# Patient Record
Sex: Female | Born: 1935 | Race: White | Hispanic: No
Health system: Southern US, Community
[De-identification: ages and names within clinical notes are randomized; demographics above are authoritative.]

## PROBLEM LIST (undated history)

## (undated) DIAGNOSIS — M1990 Unspecified osteoarthritis, unspecified site: Secondary | ICD-10-CM

## (undated) DIAGNOSIS — K589 Irritable bowel syndrome without diarrhea: Secondary | ICD-10-CM

## (undated) DIAGNOSIS — I10 Essential (primary) hypertension: Secondary | ICD-10-CM

## (undated) DIAGNOSIS — E119 Type 2 diabetes mellitus without complications: Secondary | ICD-10-CM

## (undated) DIAGNOSIS — Z8719 Personal history of other diseases of the digestive system: Secondary | ICD-10-CM

## (undated) DIAGNOSIS — I82409 Acute embolism and thrombosis of unspecified deep veins of unspecified lower extremity: Secondary | ICD-10-CM

## (undated) DIAGNOSIS — C4490 Unspecified malignant neoplasm of skin, unspecified: Secondary | ICD-10-CM

## (undated) DIAGNOSIS — I499 Cardiac arrhythmia, unspecified: Secondary | ICD-10-CM

## (undated) HISTORY — DX: Irritable bowel syndrome without diarrhea: K58.9

## (undated) HISTORY — PX: LAPAROSCOPY: SHX197

## (undated) HISTORY — PX: REPLACEMENT TOTAL KNEE: SUR1224

## (undated) HISTORY — DX: Unspecified osteoarthritis, unspecified site: M19.90

## (undated) HISTORY — DX: Cardiac arrhythmia, unspecified: I49.9

## (undated) HISTORY — DX: Type 2 diabetes mellitus without complications: E11.9

## (undated) HISTORY — DX: Personal history of other diseases of the digestive system: Z87.19

## (undated) HISTORY — PX: CARDIAC CATHETERIZATION: SHX172

## (undated) HISTORY — PX: PARTIAL HYSTERECTOMY: SHX80

## (undated) HISTORY — PX: CATARACT EXTRACTION: SUR2

## (undated) HISTORY — DX: Unspecified malignant neoplasm of skin, unspecified: C44.90

## (undated) HISTORY — DX: Acute embolism and thrombosis of unspecified deep veins of unspecified lower extremity: I82.409

## (undated) HISTORY — PX: CHOLECYSTECTOMY: SHX55

## (undated) HISTORY — DX: Essential (primary) hypertension: I10

---

## 1998-07-11 ENCOUNTER — Emergency Department (HOSPITAL_COMMUNITY): Admission: EM | Admit: 1998-07-11 | Discharge: 1998-07-11 | Payer: Self-pay | Admitting: Emergency Medicine

## 2002-12-03 ENCOUNTER — Emergency Department (HOSPITAL_COMMUNITY): Admission: EM | Admit: 2002-12-03 | Discharge: 2002-12-03 | Payer: Self-pay | Admitting: Emergency Medicine

## 2002-12-03 ENCOUNTER — Encounter: Payer: Self-pay | Admitting: Emergency Medicine

## 2003-06-07 ENCOUNTER — Other Ambulatory Visit: Payer: Self-pay

## 2003-08-23 ENCOUNTER — Other Ambulatory Visit: Payer: Self-pay

## 2003-08-27 ENCOUNTER — Inpatient Hospital Stay (HOSPITAL_COMMUNITY): Admission: EM | Admit: 2003-08-27 | Discharge: 2003-08-31 | Payer: Self-pay

## 2005-02-22 ENCOUNTER — Other Ambulatory Visit: Payer: Self-pay

## 2005-02-22 ENCOUNTER — Emergency Department: Payer: Self-pay | Admitting: Emergency Medicine

## 2008-04-12 ENCOUNTER — Ambulatory Visit: Payer: Self-pay | Admitting: Unknown Physician Specialty

## 2008-04-24 ENCOUNTER — Ambulatory Visit: Payer: Self-pay | Admitting: Unknown Physician Specialty

## 2008-11-14 ENCOUNTER — Ambulatory Visit: Payer: Self-pay

## 2008-11-24 ENCOUNTER — Ambulatory Visit: Payer: Self-pay

## 2009-09-03 ENCOUNTER — Inpatient Hospital Stay: Payer: Self-pay | Admitting: Internal Medicine

## 2009-10-18 ENCOUNTER — Inpatient Hospital Stay: Payer: Self-pay | Admitting: Specialist

## 2010-11-27 ENCOUNTER — Ambulatory Visit: Payer: Self-pay | Admitting: Ophthalmology

## 2010-12-10 ENCOUNTER — Ambulatory Visit: Payer: Self-pay | Admitting: Ophthalmology

## 2011-01-29 ENCOUNTER — Ambulatory Visit: Payer: Self-pay | Admitting: Ophthalmology

## 2011-02-11 ENCOUNTER — Ambulatory Visit: Payer: Self-pay | Admitting: Ophthalmology

## 2011-05-30 ENCOUNTER — Ambulatory Visit: Payer: Self-pay | Admitting: Unknown Physician Specialty

## 2011-06-09 LAB — TROPONIN I: Troponin-I: <0.02 ng/mL

## 2011-06-09 LAB — BASIC METABOLIC PANEL
Anion Gap: 7 (ref 7–16)
Calcium, Total: 8.9 mg/dL (ref 8.5–10.1)
Co2: 31 mmol/L (ref 21–32)
EGFR (African American): >60
EGFR (Non-African Amer.): >60
Potassium: 4 mmol/L (ref 3.5–5.1)
Sodium: 132 mmol/L — ABNORMAL LOW (ref 136–145)

## 2011-06-09 LAB — CBC
MCH: 30.9 pg (ref 26.0–34.0)
Platelet: 181 10*3/uL (ref 150–440)
RBC: 4.1 10*6/uL (ref 3.80–5.20)
WBC: 9.3 10*3/uL (ref 3.6–11.0)

## 2011-06-10 ENCOUNTER — Observation Stay: Payer: Self-pay | Admitting: Internal Medicine

## 2011-06-10 LAB — CK TOTAL AND CKMB (NOT AT ARMC)
CK, Total: 41 U/L (ref 21–215)
CK, Total: 50 U/L (ref 21–215)
CK-MB: 0.6 ng/mL (ref 0.5–3.6)

## 2011-06-10 LAB — TROPONIN I: Troponin-I: <0.02 ng/mL

## 2011-08-08 ENCOUNTER — Emergency Department: Payer: Self-pay | Admitting: Internal Medicine

## 2011-08-08 LAB — URINALYSIS, COMPLETE
Bilirubin,UR: NEGATIVE
Blood: NEGATIVE
Glucose,UR: NEGATIVE mg/dL (ref 0–75)
Ketone: NEGATIVE
Ph: 8 (ref 4.5–8.0)
RBC,UR: 1 /HPF (ref 0–5)
WBC UR: 10 /HPF (ref 0–5)

## 2011-08-08 LAB — COMPREHENSIVE METABOLIC PANEL
Albumin: 3.4 g/dL (ref 3.4–5.0)
Alkaline Phosphatase: 91 U/L (ref 50–136)
Bilirubin,Total: 0.5 mg/dL (ref 0.2–1.0)
SGOT(AST): 25 U/L (ref 15–37)
SGPT (ALT): 15 U/L
Total Protein: 7.8 g/dL (ref 6.4–8.2)

## 2011-08-08 LAB — CBC
HCT: 37.3 % (ref 35.0–47.0)
HGB: 12.8 g/dL (ref 12.0–16.0)
Platelet: 209 10*3/uL (ref 150–440)
RDW: 12.9 % (ref 11.5–14.5)

## 2011-08-08 LAB — TROPONIN I: Troponin-I: <0.02 ng/mL

## 2011-12-12 ENCOUNTER — Ambulatory Visit: Payer: Self-pay

## 2012-01-04 ENCOUNTER — Emergency Department: Payer: Self-pay | Admitting: Emergency Medicine

## 2012-01-04 LAB — CBC WITH DIFFERENTIAL/PLATELET
Basophil #: 0 10*3/uL (ref 0.0–0.1)
HCT: 39.1 % (ref 35.0–47.0)
Lymphocyte #: 1.4 10*3/uL (ref 1.0–3.6)
MCH: 30.5 pg (ref 26.0–34.0)
MCV: 90 fL (ref 80–100)
Monocyte #: 0.4 x10 3/mm (ref 0.2–0.9)
Monocyte %: 4.9 %
Neutrophil #: 6.4 10*3/uL (ref 1.4–6.5)
RDW: 12.9 % (ref 11.5–14.5)
WBC: 8.3 10*3/uL (ref 3.6–11.0)

## 2012-01-04 LAB — COMPREHENSIVE METABOLIC PANEL
BUN: 14 mg/dL (ref 7–18)
Chloride: 95 mmol/L — ABNORMAL LOW (ref 98–107)
Co2: 31 mmol/L (ref 21–32)
Creatinine: 0.88 mg/dL (ref 0.60–1.30)
Glucose: 206 mg/dL — ABNORMAL HIGH (ref 65–99)
Potassium: 3.9 mmol/L (ref 3.5–5.1)
Sodium: 133 mmol/L — ABNORMAL LOW (ref 136–145)
Total Protein: 7.6 g/dL (ref 6.4–8.2)

## 2012-01-04 LAB — URINALYSIS, COMPLETE
Protein: NEGATIVE
Specific Gravity: 1.01 (ref 1.003–1.030)
Squamous Epithelial: 1

## 2012-01-04 LAB — LIPASE, BLOOD: Lipase: 79 U/L (ref 73–393)

## 2012-01-06 LAB — URINE CULTURE

## 2012-01-06 LAB — STOOL CULTURE

## 2012-05-16 ENCOUNTER — Inpatient Hospital Stay: Payer: Self-pay | Admitting: Internal Medicine

## 2012-05-16 LAB — COMPREHENSIVE METABOLIC PANEL
Anion Gap: 5 — ABNORMAL LOW (ref 7–16)
Calcium, Total: 8.9 mg/dL (ref 8.5–10.1)
Chloride: 98 mmol/L (ref 98–107)
Co2: 31 mmol/L (ref 21–32)
EGFR (Non-African Amer.): >60
Osmolality: 271 (ref 275–301)
Potassium: 3.4 mmol/L — ABNORMAL LOW (ref 3.5–5.1)
Sodium: 134 mmol/L — ABNORMAL LOW (ref 136–145)

## 2012-05-16 LAB — URINALYSIS, COMPLETE
Ketone: NEGATIVE
Protein: NEGATIVE
Specific Gravity: 1.006 (ref 1.003–1.030)
WBC UR: 19 /HPF (ref 0–5)

## 2012-05-16 LAB — CK TOTAL AND CKMB (NOT AT ARMC): CK-MB: 0.9 ng/mL (ref 0.5–3.6)

## 2012-05-16 LAB — CBC
HCT: 40.9 % (ref 35.0–47.0)
MCHC: 34.6 g/dL (ref 32.0–36.0)
MCV: 89 fL (ref 80–100)
Platelet: 157 10*3/uL (ref 150–440)
RDW: 12.7 % (ref 11.5–14.5)

## 2012-05-16 LAB — MAGNESIUM: Magnesium: 1.8 mg/dL

## 2012-05-16 LAB — TROPONIN I: Troponin-I: 0.07 ng/mL — ABNORMAL HIGH

## 2012-05-16 LAB — HEMOGLOBIN A1C: Hemoglobin A1C: 6.7 % — ABNORMAL HIGH (ref 4.2–6.3)

## 2012-05-17 LAB — COMPREHENSIVE METABOLIC PANEL
Alkaline Phosphatase: 84 U/L (ref 50–136)
Calcium, Total: 8.5 mg/dL (ref 8.5–10.1)
Chloride: 95 mmol/L — ABNORMAL LOW (ref 98–107)
Co2: 30 mmol/L (ref 21–32)
EGFR (Non-African Amer.): 55 — ABNORMAL LOW
Sodium: 132 mmol/L — ABNORMAL LOW (ref 136–145)

## 2012-05-17 LAB — CBC WITH DIFFERENTIAL/PLATELET
Basophil #: 0.1 10*3/uL (ref 0.0–0.1)
Basophil %: 0.7 %
Eosinophil #: 0.1 10*3/uL (ref 0.0–0.7)
HCT: 35.3 % (ref 35.0–47.0)
Lymphocyte #: 2.7 10*3/uL (ref 1.0–3.6)
Lymphocyte %: 30.5 %
MCH: 30.5 pg (ref 26.0–34.0)
MCHC: 34.3 g/dL (ref 32.0–36.0)
MCV: 89 fL (ref 80–100)
Monocyte #: 0.4 x10 3/mm (ref 0.2–0.9)
Neutrophil #: 5.5 10*3/uL (ref 1.4–6.5)
RDW: 13.2 % (ref 11.5–14.5)

## 2012-05-17 LAB — CK TOTAL AND CKMB (NOT AT ARMC)
CK, Total: 38 U/L (ref 21–215)
CK-MB: <0.5 ng/mL — ABNORMAL LOW (ref 0.5–3.6)

## 2012-05-18 LAB — URINE CULTURE

## 2012-11-16 ENCOUNTER — Emergency Department: Payer: Self-pay | Admitting: Emergency Medicine

## 2012-11-16 LAB — URINALYSIS, COMPLETE
Blood: NEGATIVE
Glucose,UR: NEGATIVE mg/dL (ref 0–75)
Ketone: NEGATIVE
Nitrite: NEGATIVE
Ph: 7 (ref 4.5–8.0)
Protein: NEGATIVE
Specific Gravity: 1.009 (ref 1.003–1.030)
WBC UR: 3 /HPF (ref 0–5)

## 2012-11-16 LAB — COMPREHENSIVE METABOLIC PANEL
Albumin: 3.5 g/dL (ref 3.4–5.0)
Anion Gap: 6 — ABNORMAL LOW (ref 7–16)
BUN: 12 mg/dL (ref 7–18)
Bilirubin,Total: 1 mg/dL (ref 0.2–1.0)
Calcium, Total: 8.9 mg/dL (ref 8.5–10.1)
Chloride: 96 mmol/L — ABNORMAL LOW (ref 98–107)
Co2: 30 mmol/L (ref 21–32)
Creatinine: 0.92 mg/dL (ref 0.60–1.30)
Osmolality: 270 (ref 275–301)
Potassium: 3.5 mmol/L (ref 3.5–5.1)
SGOT(AST): 22 U/L (ref 15–37)
Total Protein: 7.5 g/dL (ref 6.4–8.2)

## 2012-11-16 LAB — CBC
HGB: 12.4 g/dL (ref 12.0–16.0)
MCV: 89 fL (ref 80–100)
RDW: 13.1 % (ref 11.5–14.5)

## 2012-11-16 LAB — TROPONIN I: Troponin-I: <0.02 ng/mL

## 2012-11-16 LAB — CK TOTAL AND CKMB (NOT AT ARMC): CK-MB: 0.8 ng/mL (ref 0.5–3.6)

## 2013-09-15 ENCOUNTER — Ambulatory Visit: Payer: Self-pay | Admitting: Family Medicine

## 2013-12-22 ENCOUNTER — Ambulatory Visit: Payer: Self-pay | Admitting: Family Medicine

## 2013-12-25 LAB — BASIC METABOLIC PANEL
Anion Gap: 7 (ref 7–16)
BUN: 11 mg/dL (ref 7–18)
Calcium, Total: 8.8 mg/dL (ref 8.5–10.1)
Chloride: 97 mmol/L — ABNORMAL LOW (ref 98–107)
Co2: 29 mmol/L (ref 21–32)
Creatinine: 0.84 mg/dL (ref 0.60–1.30)
EGFR (African American): >60
EGFR (Non-African Amer.): >60
GLUCOSE: 195 mg/dL — AB (ref 65–99)
Osmolality: 271 (ref 275–301)
Potassium: 3.9 mmol/L (ref 3.5–5.1)
Sodium: 133 mmol/L — ABNORMAL LOW (ref 136–145)

## 2013-12-25 LAB — CBC
HCT: 39 % (ref 35.0–47.0)
HGB: 13.1 g/dL (ref 12.0–16.0)
MCH: 31 pg (ref 26.0–34.0)
MCHC: 33.6 g/dL (ref 32.0–36.0)
MCV: 92 fL (ref 80–100)
Platelet: 160 10*3/uL (ref 150–440)
RBC: 4.23 10*6/uL (ref 3.80–5.20)
RDW: 13.6 % (ref 11.5–14.5)
WBC: 7.1 10*3/uL (ref 3.6–11.0)

## 2013-12-25 LAB — PRO B NATRIURETIC PEPTIDE: B-Type Natriuretic Peptide: 696 pg/mL — ABNORMAL HIGH (ref 0–450)

## 2013-12-25 LAB — TROPONIN I: Troponin-I: <0.02 ng/mL

## 2013-12-26 ENCOUNTER — Observation Stay: Payer: Self-pay | Admitting: Internal Medicine

## 2013-12-26 LAB — CBC WITH DIFFERENTIAL/PLATELET
BASOS ABS: 0.1 10*3/uL (ref 0.0–0.1)
Basophil %: 1.1 %
Eosinophil #: 0.1 10*3/uL (ref 0.0–0.7)
Eosinophil %: 1.5 %
HCT: 38.1 % (ref 35.0–47.0)
HGB: 12.9 g/dL (ref 12.0–16.0)
Lymphocyte #: 2.7 10*3/uL (ref 1.0–3.6)
Lymphocyte %: 27.9 %
MCH: 30.7 pg (ref 26.0–34.0)
MCHC: 33.9 g/dL (ref 32.0–36.0)
MCV: 91 fL (ref 80–100)
MONO ABS: 0.5 x10 3/mm (ref 0.2–0.9)
Monocyte %: 5.4 %
NEUTROS PCT: 64.1 %
Neutrophil #: 6.2 10*3/uL (ref 1.4–6.5)
Platelet: 168 10*3/uL (ref 150–440)
RBC: 4.21 10*6/uL (ref 3.80–5.20)
RDW: 13.5 % (ref 11.5–14.5)
WBC: 9.7 10*3/uL (ref 3.6–11.0)

## 2013-12-26 LAB — HEMOGLOBIN A1C: Hemoglobin A1C: 6.3 % (ref 4.2–6.3)

## 2013-12-26 LAB — CK TOTAL AND CKMB (NOT AT ARMC)
CK, TOTAL: 43 U/L
CK, TOTAL: 54 U/L
CK, Total: 43 U/L
CK-MB: 0.9 ng/mL (ref 0.5–3.6)
CK-MB: 1 ng/mL (ref 0.5–3.6)
CK-MB: 1 ng/mL (ref 0.5–3.6)

## 2013-12-26 LAB — TROPONIN I: Troponin-I: <0.02 ng/mL

## 2013-12-26 LAB — BASIC METABOLIC PANEL
Anion Gap: 8 (ref 7–16)
BUN: 10 mg/dL (ref 7–18)
Calcium, Total: 8.8 mg/dL (ref 8.5–10.1)
Chloride: 99 mmol/L (ref 98–107)
Co2: 27 mmol/L (ref 21–32)
Creatinine: 0.75 mg/dL (ref 0.60–1.30)
EGFR (Non-African Amer.): >60
Glucose: 108 mg/dL — ABNORMAL HIGH (ref 65–99)
Osmolality: 268 (ref 275–301)
Potassium: 3.5 mmol/L (ref 3.5–5.1)
Sodium: 134 mmol/L — ABNORMAL LOW (ref 136–145)

## 2013-12-26 LAB — MAGNESIUM: Magnesium: 1.8 mg/dL

## 2013-12-30 LAB — CULTURE, BLOOD (SINGLE)

## 2014-01-03 ENCOUNTER — Ambulatory Visit: Payer: Self-pay | Admitting: Family

## 2014-01-18 LAB — URINALYSIS, COMPLETE
BILIRUBIN, UR: NEGATIVE
BLOOD: NEGATIVE
Glucose,UR: NEGATIVE mg/dL (ref 0–75)
KETONE: NEGATIVE
NITRITE: NEGATIVE
Ph: 5 (ref 4.5–8.0)
Protein: 30
RBC,UR: 3 /HPF (ref 0–5)
SPECIFIC GRAVITY: 1.02 (ref 1.003–1.030)
WBC UR: 242 /HPF (ref 0–5)

## 2014-01-18 LAB — CBC
HCT: 35.1 % (ref 35.0–47.0)
HGB: 11.6 g/dL — ABNORMAL LOW (ref 12.0–16.0)
MCH: 30.4 pg (ref 26.0–34.0)
MCHC: 33.1 g/dL (ref 32.0–36.0)
MCV: 92 fL (ref 80–100)
PLATELETS: 177 10*3/uL (ref 150–440)
RBC: 3.82 10*6/uL (ref 3.80–5.20)
RDW: 13.5 % (ref 11.5–14.5)
WBC: 6.8 10*3/uL (ref 3.6–11.0)

## 2014-01-18 LAB — COMPREHENSIVE METABOLIC PANEL
ALBUMIN: 3.2 g/dL — AB (ref 3.4–5.0)
Alkaline Phosphatase: 77 U/L
Anion Gap: 8 (ref 7–16)
BILIRUBIN TOTAL: 0.6 mg/dL (ref 0.2–1.0)
BUN: 17 mg/dL (ref 7–18)
CHLORIDE: 100 mmol/L (ref 98–107)
CREATININE: 1.05 mg/dL (ref 0.60–1.30)
Calcium, Total: 8.7 mg/dL (ref 8.5–10.1)
Co2: 28 mmol/L (ref 21–32)
EGFR (African American): 55 — ABNORMAL LOW
EGFR (Non-African Amer.): 47 — ABNORMAL LOW
GLUCOSE: 171 mg/dL — AB (ref 65–99)
Osmolality: 278 (ref 275–301)
Potassium: 4.1 mmol/L (ref 3.5–5.1)
SGOT(AST): 25 U/L (ref 15–37)
SGPT (ALT): 11 U/L — ABNORMAL LOW
Sodium: 136 mmol/L (ref 136–145)
TOTAL PROTEIN: 7.3 g/dL (ref 6.4–8.2)

## 2014-01-18 LAB — TROPONIN I
Troponin-I: <0.02 ng/mL
Troponin-I: <0.02 ng/mL
Troponin-I: <0.02 ng/mL

## 2014-01-18 LAB — APTT: ACTIVATED PTT: 26.4 s (ref 23.6–35.9)

## 2014-01-18 LAB — PROTIME-INR
INR: 1.1
PROTHROMBIN TIME: 14.1 s (ref 11.5–14.7)

## 2014-01-19 ENCOUNTER — Inpatient Hospital Stay: Payer: Self-pay | Admitting: Internal Medicine

## 2014-01-19 ENCOUNTER — Ambulatory Visit: Payer: Self-pay | Admitting: Neurology

## 2014-01-19 DIAGNOSIS — R55 Syncope and collapse: Secondary | ICD-10-CM

## 2014-01-19 DIAGNOSIS — I10 Essential (primary) hypertension: Secondary | ICD-10-CM

## 2014-01-19 LAB — CBC WITH DIFFERENTIAL/PLATELET
BASOS ABS: 0.1 10*3/uL (ref 0.0–0.1)
BASOS PCT: 1.2 %
EOS ABS: 0.3 10*3/uL (ref 0.0–0.7)
Eosinophil %: 3.6 %
HCT: 36.3 % (ref 35.0–47.0)
HGB: 12.1 g/dL (ref 12.0–16.0)
Lymphocyte #: 2.2 10*3/uL (ref 1.0–3.6)
Lymphocyte %: 29.2 %
MCH: 30.4 pg (ref 26.0–34.0)
MCHC: 33.3 g/dL (ref 32.0–36.0)
MCV: 91 fL (ref 80–100)
Monocyte #: 0.4 x10 3/mm (ref 0.2–0.9)
Monocyte %: 5.1 %
NEUTROS ABS: 4.5 10*3/uL (ref 1.4–6.5)
Neutrophil %: 60.9 %
Platelet: 153 10*3/uL (ref 150–440)
RBC: 3.97 10*6/uL (ref 3.80–5.20)
RDW: 13.4 % (ref 11.5–14.5)
WBC: 7.4 10*3/uL (ref 3.6–11.0)

## 2014-01-19 LAB — BASIC METABOLIC PANEL
Anion Gap: 8 (ref 7–16)
BUN: 15 mg/dL (ref 7–18)
CALCIUM: 8.6 mg/dL (ref 8.5–10.1)
CREATININE: 0.83 mg/dL (ref 0.60–1.30)
Chloride: 103 mmol/L (ref 98–107)
Co2: 27 mmol/L (ref 21–32)
EGFR (African American): >60
EGFR (Non-African Amer.): >60
GLUCOSE: 129 mg/dL — AB (ref 65–99)
Osmolality: 278 (ref 275–301)
POTASSIUM: 3.8 mmol/L (ref 3.5–5.1)
Sodium: 138 mmol/L (ref 136–145)

## 2014-01-21 ENCOUNTER — Emergency Department: Payer: Self-pay | Admitting: Emergency Medicine

## 2014-01-21 LAB — COMPREHENSIVE METABOLIC PANEL
ALBUMIN: 2.9 g/dL — AB (ref 3.4–5.0)
ALT: 28 U/L
Alkaline Phosphatase: 79 U/L
Anion Gap: 7 (ref 7–16)
BUN: 14 mg/dL (ref 7–18)
Bilirubin,Total: 0.8 mg/dL (ref 0.2–1.0)
CREATININE: 0.8 mg/dL (ref 0.60–1.30)
Calcium, Total: 8.6 mg/dL (ref 8.5–10.1)
Chloride: 103 mmol/L (ref 98–107)
Co2: 26 mmol/L (ref 21–32)
EGFR (African American): >60
Glucose: 184 mg/dL — ABNORMAL HIGH (ref 65–99)
OSMOLALITY: 277 (ref 275–301)
Potassium: 4 mmol/L (ref 3.5–5.1)
SGOT(AST): 30 U/L (ref 15–37)
Sodium: 136 mmol/L (ref 136–145)
Total Protein: 6.8 g/dL (ref 6.4–8.2)

## 2014-01-21 LAB — URINALYSIS, COMPLETE
BILIRUBIN, UR: NEGATIVE
Blood: NEGATIVE
Glucose,UR: NEGATIVE mg/dL (ref 0–75)
Hyaline Cast: 4
KETONE: NEGATIVE
LEUKOCYTE ESTERASE: NEGATIVE
Nitrite: NEGATIVE
Ph: 5 (ref 4.5–8.0)
Protein: 30
RBC,UR: 2 /HPF (ref 0–5)
Specific Gravity: 1.02 (ref 1.003–1.030)
Squamous Epithelial: 2
WBC UR: 16 /HPF (ref 0–5)

## 2014-01-21 LAB — CBC
HCT: 33.8 % — ABNORMAL LOW (ref 35.0–47.0)
HGB: 11.2 g/dL — ABNORMAL LOW (ref 12.0–16.0)
MCH: 30.6 pg (ref 26.0–34.0)
MCHC: 33.3 g/dL (ref 32.0–36.0)
MCV: 92 fL (ref 80–100)
Platelet: 141 10*3/uL — ABNORMAL LOW (ref 150–440)
RBC: 3.67 10*6/uL — AB (ref 3.80–5.20)
RDW: 13.3 % (ref 11.5–14.5)
WBC: 8.3 10*3/uL (ref 3.6–11.0)

## 2014-01-21 LAB — PRO B NATRIURETIC PEPTIDE: B-Type Natriuretic Peptide: 654 pg/mL — ABNORMAL HIGH (ref 0–450)

## 2014-01-21 LAB — TROPONIN I

## 2014-01-21 LAB — TSH: THYROID STIMULATING HORM: 4.58 u[IU]/mL — AB

## 2014-01-22 LAB — URINE CULTURE

## 2014-09-19 NOTE — Discharge Summary (Signed)
PATIENT NAME:  Erika Huff, Erika Huff MR#:  [id number] DATE OF BIRTH:  23-Dec-1923  DATE OF ADMISSION:  05/16/2012 DATE OF DISCHARGE:  05/19/2012  DISCHARGE DIAGNOSES: 1. Strokelike symptoms secondary to malignant hypertension, resolved. 2. Anxiety and depression.  3. Overactive bladder.  4. History of hypertension. 5. Vertigo.  DIET: Low sodium diet.  DISPOSITION: Discharge home with home physical therapy. Advised to follow up with Dr. Jeananne Rama. She has an appointment with Dr. Jeananne Rama on 05/25/2011, Monday.   CONSULTANTS: Lanae Boast, MD  ALLERGIES: She has multiple allergies to multiple antibiotics and medications.   HOSPITAL COURSE:  1. This is an 81 year old female patient with history of proximal atrial fibrillation, diastole heart failure, hypertension, diet-controlled diabetes deep, DVT and history of chronic low back pain who came in because of not feeling well. The patient felt weak and had numbness of the body, on the left side. The patient's systolic blood pressure was more than 200. The patient's blood pressure was 221/190 on admission. The patient has been admitted to hospitalist service for management of hypertension with questionable stroke because of left-sided weakness. The patient was started her home blood pressure medications and added nitro   topical paste along with clonidine. The patient was monitored on telemetry. CT of head on admission did not show any acute changes, except atrophy. The patient got a MRI of the brain and carotid ultrasound. The patient was taking aspirin before she came, but she was started on Plavix because of her history and questionable stroke. The patient's blood pressure improved nicely with heart rate was in the 50s. She was getting Coreg 25 mg twice a day, but we had to decrease it because of bradycardia. She was continued on her HCTZ 25 mg daily. The patient's troponins are negative. EKG did not show any acute changes. Her MRI of the brain also did  not show any stroke. The patient's has chronic vessel ischemic changes and Echo showed normal EF with binocular pattern. The patient was thought to have stroke symptoms secondary to her uncontrolled hypertension. As I mentioned, she was started on Imdur 30 mg in the morning to help her with blood pressure and she can continue her HCTZ that she is taking. We will continue that and decrease the Coreg because of bradycardia. The patient's blood pressure at the time of discharge is stable.  2. Klebsiella UTI by urine culture, but UA did not show any leukocyte esterase. no nitrites. The patient's WBC is stable, did not have any fever, and WBC 8.9. The patient's BUN is 12 and creatinine 0.67. Urinalysis showed leukocyte esterase 1+ and WBC only 19. So because of her multiple allergies to antibiotics, I asked Dr. Clayborn Bigness to see to decide on which antibiotic she can take. She was started on Macrodantin in the hospital, but because Klebsiella is resistant to Macrodantin, she did not have fever, WBC was normal, and the patient's is UA is not significant for any infection, he suggested that because of her history ofhysterectomy, urinary retention, she has asymptomatic bacteriuria which does not require  treatment, we did not give her any antibiotics and felt that antibiotic is not needed and she has multiple allergies, and in case she really needs this she will not have any choice. This was conveyed to the patient's daughter in detail.  3. The patient has depression and anxiety and she was  started on Zoloft here to help her with symptoms. Her daughter was concerned about her being emotionally unstable sometimes, giving her a  lot of stress, and asked for some help to see if she can be started on any SSRI or antidepressant so we started her on Zoloft 25 mg p.o. at bedtime. The patient was seen by the physical therapist who recommended physical therapy and home with home health. A prescription was given  for RW  4. Mild  hyponatremia on admission, improved with the fluids. The patient had some urinary retention in the Emergency Room, but she was started on oxybutynin for overactive bladder here. The patient initially got a Foley in the ER, but that was discontinued and we checked post residual. It was around 150. The patient did not have any problems urinating. So, the patient is discharged home in stable condition.   TIME SPENT ON DISCHARGE PREPARATION:  More than 30 minutes.  ____________________________ Epifanio Lesches, MD sk:sb D: 05/20/2012 23:22:45 ET T: 05/21/2012 11:09:13 ET JOB#: [id number]  cc: Epifanio Lesches, MD, <Dictator> Epifanio Lesches MD ELECTRONICALLY SIGNED 05/23/2012 13:26
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
## 2014-09-19 NOTE — Consult Note (Signed)
Impression: 81yo female w/ h/o urinary overflow incontinence and HTN admitted with hypertensive urgency and bacturia.  Her stroke-like symptoms appear to be related to her elevated BP on admission.  They have resolved with control of her BP.  Her MRI showed no evidence of CVA. She denies symptoms specific for UTI.  She has some lower abd cramping, but no dysuria or frequency.  She has no nitrites or  and minimal LE on her urinalysis.  I suspect that given her history of hysterectomy and urinary incontinence that she has structural anatomical changes which cause her to retain urine.  She then has urinary overflow incontinence.  This allow for bacterial colonization of the bladder.  There is no need to treat asymptomatic bacturia. She does have multiple allergies which may make treatment of an infection difficult in the future.  She is a poor historian, but does relate swelling and breathing problems with many of the antibiotics listed on her allergy list.    Electronic Signatures: Marciana Uplinger, Heinz Knuckles (MD) (Signed on 17-Dec-13 15:55)  Authored   Last Updated: 17-Dec-13 16:05 by Adine Heimann, Heinz Knuckles (MD)

## 2014-09-22 NOTE — H&P (Signed)
PATIENT NAME:  Erika Huff, Erika Huff MR#:  [id number] DATE OF BIRTH:  18-Sep-1923  DATE OF ADMISSION:  05/16/2012  PRIMARY CARE PHYSICIAN: Dr. Jeananne Rama.   HISTORY OF PRESENT ILLNESS: The patient is an 81 year old Caucasian female with past medical history significant for history of paroxysmal atrial fibrillation, history of congestive heart failure diastolic, hypertension, diabetes, deep venous thrombosis, chronic lower back pain and vertigo who presented to the hospital with complaints of not feeling well. According to the patient, she was watching TV earlier today when she tried to get up to go to turn off her heater and hence she started falling. She was able to reach out and grab the heater itself. She was very unsteady on her feet. Then she finally came back to her bed and lay down on the bed. She stated that she probably almost passed out on the bed and she felt numbness and weakness coming over her, mostly on the left side. She felt weakness as well as numbness  feeling on the left side of her body, mostly on the face, then it went to the arm and then to her leg. Again, she tells me that she felt cold in both legs and felt very weak. Because of that weakness, she decided to come to the Emergency Room for further evaluation. In the Emergency Room, she was noted to be severely hypertensive with systolic blood pressure above 200 and was found to have urinary tract infection and hospitalist services were contacted for admission.   PAST MEDICAL HISTORY: See history of admission for chest pain. Underwent Myoview stress test in January 2013, which was negative. History of paroxysmal atrial fibrillation, it resolved in normal sinus rhythm, congestive heart failure, chronic diastolic hypertension, diabetes mellitus, DVT, osteoarthritis, chronic lower back pain, symmetrical, history of irritable bowel syndrome, diverticulosis with GI bleed in the past, history of radicular leg pain, osteoporosis, nephrolithiasis, history  of atrial fibrillation, previously on Coumadin therapy and now stopped since GI bleed in the past and history of left lower extremity clot.   PAST SURGICAL HISTORY: Exploratory laparotomy to dissect  varicose veins around ovaries, cholecystectomy, carpal tunnel surgery, multiple surgeries for skin cancer, right elbow surgery, bilateral knee surgeries, partial hysterectomy, cardiac catheterization in [place] in 2011 and bilateral cataract surgery.  ALLERGIES: MULTIPLE INCLUDING ALTACE. AMOXICILLIN, AVELOX, BENADRYL, CELEBREX, CIPRO, CODEINE, COZAAR, DIOVAN, ENTEX, EVISTA, GLUCOPHAGE, KEFLEX, NORVASC, PAXIL, PENICILLIN, SULFA, TESSALON PEARLS, VASOTEC AS WELL AS ZITHROMAX.   MEDICATIONS: According to medical records, the patient is on aspirin 81 mg p.o. daily, carvedilol 25 mg daily, cetirizine 10 mg p.o. daily, hydrochlorothiazide 25 mg p.o. daily, meclizine 25 mg 3 times daily as needed. She is also on neo/poly/dex ophthalmic suspension one drop to affected eye twice a day due to recent diagnosis of right eye infection.   SOCIAL HISTORY: The patient lives in [place] with her daughter. No tobacco, alcohol or drug abuse.   FAMILY HISTORY: Diabetes. The patient's son had prostate cancer.   REVIEW OF SYSTEMS:  CONSTITUTIONAL: Multiple problems including feeling feverish and chilly. She has significant fatigue and weakness. She has pains in the lower part of her abdomen, suprapubic area since yesterday. She has been having problems with weight loss from 200 down to 160 pounds in approximately 1 year. Bilateral cataract removal, now she uses reading glasses. She has right infected eye according to her ophthalmologist. She has been having problems with dry nose. Complaints of greenish phlegm and some intermittent bleeding per rectum. Cough with phlegm production as well  as hemoptysis, also intermittent shortness of breath, intermittent chest pains on and off over the past 1 year. Arrhythmias and possibly  syncopal earlier today and palpitations. Also intermittent diarrhea and urinary incontinence. The patient tells me that she has a large amount of  watery stool early in the mornings, each morning. She has been using pads for her urinary incontinence, which is urge as well as stress. She has been having problems with dysuria, burning sensation as well as pain on urination. Denies hematuria. Denies any fevers, weight gain.  EYES: Denies any blurry vision, double vision or glaucoma.  ENT: Denies any tinnitus, allergies, epistaxis, sinus pain, dentures, or difficulty swallowing.  RESPIRATORY: Denies any wheezes, asthma or chronic obstructive pulmonary disease.  CARDIOVASCULAR: Denies any orthopnea or edema.  GASTROINTESTINAL: Denies nausea, vomiting, hematemesis, rectal bleeding or change in bowel habits. Admits to intermittent rectal bleeding. GENITOURINARY: Denies hematuria, increased frequency of urination. incontinence.  SKIN: Denies any acne, rashes, lesions, or change in moles.  MUSCULOSKELETAL: Denies arthritis, cramps, swelling. Admits to having left-sided numbness and weakness, especially lower extremity weakness. Denies any dysarthria, but admits of having some problems with slurring of speech earlier today.  PSYCHIATRIC: Denies anxiety, insomnia or depression.   PHYSICAL EXAMINATION:  VITAL SIGNS: On arrival to the hospital, temperature 97.4, pulse 100, respirations 18, blood pressure 221/119, and oxygen saturation was 97% on room air.  GENERAL: This is a well-developed, well-nourished, obese, Caucasian female, somewhat anxious and uncomfortable grabbing intermittently her lower part of the abdomen, sitting on the stretcher.  HEENT: Her pupils are equal and reactive to light. No icterus or conjunctivitis. Has normal hearing. No pharyngeal erythema. Mucosa is moist.  NECK: No masses, supple, nontender. Thyroid is not enlarged. No adenopathy. No JVD or carotid bruits bilaterally. Full range of  motion.  LUNGS: Clear to auscultation anteriorly, however, some rales as well as crackles are heard posteriorly in all lung fields, mostly on the left side. No significant diminished breath sounds or wheezing. No labored respirations, increased effort, dullness to percussion. Not in respiratory distress.  CARDIOVASCULAR: S1, S2 appreciated, rythm is regular, no gallops.   EXTREMITIES: peripheral pulses. No lower extremity edema, calf tenderness, or cyanosis was noted.  ABDOMEN: Soft, nontender. Bowel sounds present. No hepatosplenomegaly or masses were noted. The patient is uncomfortable whenever she is palpated in suprapubic area, but no rebound or guarding was noted.  RECTAL: Deferred.  MUSCULOSKELETAL: Able to move all extremities. The patient does have weakness 4 out of 5 in the left lower extremity as well as some mild weakness 5- our of  5 in the left upper extremity. No cyanosis. No degenerative joint disease or kyphosis was noted. Gait was not tested.  SKIN: Did not reveal any rashes, lesions, erythema, nodularity or induration. It was warm and dry to palpation.  LYMPH: No adenopathy in the cervical region.  NEUROLOGIC: Cranial nerves grossly intact. Sensory is grossly intact. No Babinski bilaterally. No dysarthria or aphasia. The patient is alert, to time, person, and place, cooperative. Memory is good. No significant confusion, agitation; however, the patient is very anxious and has multiple complaints about multiple areas.   The patient's EKG showed possible ectopic atrial rhythm at a rate of 98 beats per minute, normal axis, nonspecific T wave abnormality. Lab data showed a sodium level of 134, potassium 3.4, otherwise BMP was unremarkable. Glucose level of 156, otherwise unremarkable BMP. The patient's liver enzymes showed total protein elevated to 8.5, otherwise unremarkable study. Cardiac enzymes, first set negative.  CBC: White blood cell count 8.0, hemoglobin 14.1, platelet count 157.  Urinalysis straw clear urine, 50 mg/dL glucose, negative for bilirubin, or ketones, specific gravity 1.006, pH 5.0, negative for blood, protein, nitrites, 1+ leukocyte esterase, 2 red blood cells, 19 white blood cells, 1+ bacteria, less than 1 epithelial cell.   RADIOLOGIC STUDIES: CT scan of head without contrast on 13th December, 2013 revealed l atrophy and chronic microvascular ischemic disease changes. No acute intracranial abnormality or significant change was noted.   ASSESSMENT AND PLAN:  1.  Malignant hypertension: Admit the patient to the medical floor. Start her on blood pressure medications. Resume her outpatient medications. Add nitroglycerin topically as well as clonidine p.o. In view of her allergies, will not be able to initiate her on Norvasc or ACE inhibitor.  2.  Questionable stroke with left lower extremity weakness: Get MRI of her brain as well as carotid ultrasound and echocardiogram. We will discontinue aspirin and we will start the patient on Plavix.  3.  Dizziness: Possibly related to elevated blood pressure. Continue the patient on meclizine and treat blood pressure readings.  4.  Hyponatremia: Questionable mild dehydration. The patient was already given intravenous fluids in the Emergency Room, We will follow the patient's sodium level in the morning. We will also get a chest x-ray to rule out some element of congestive heart failure.  5.  Hypokalemia: Supplement p.o. Get magnesium level and supplement if needed.  6.  History of diabetes: Continue outpatient medications. Sliding-scale insulin.  7.  Congestive heart failure: Left heart, diastolic, chronic. We will get chest x-ray repeated again as above as the patient's clinical presentation could be consistent with congestive heart failure exacerbation.  8.  Abnormal EKG: we'll cycle cardiac enzymes x3. We will continue the patient on Coreg and will  supplement potassium as well as magnesium.  9.  Cough with questionable  bronchitis: We will initiate the patient on Levaquin. 10 Urinary tract infection: We will get urine cultures and continue the patient on Levaquin.   TIME SPENT: 50 minutes on the patient.  ____________________________ Theodoro Grist, MD rv:aw D: 05/16/2012 13:03:31 ET T: 05/17/2012 06:48:07 ET  JOB#: [id number]  cc: Guadalupe Maple, MD Esteen Delpriore MD ELECTRONICALLY SIGNED 06/23/2012 13:55

## 2014-09-23 NOTE — H&P (Signed)
PATIENT NAME:  Erika Huff, Erika Huff MR#:  [id number] DATE OF BIRTH:  1924/01/01  DATE OF ADMISSION:  12/25/2013  REFERRING PHYSICIAN: Dr. Hinda Kehr.   PRIMARY CARE PHYSICIAN: Dr. Golden Pop.   PRIMARY CARDIOLOGIST: Dr. Ubaldo Glassing.   CHIEF COMPLAINT: This is an 81 year old female with known history of diastolic CHF, paroxysmal Afib (currently in normal sinus rhythm), hypertension, diabetes, history of DVT in the past, presents with complaints of shortness of breath, cough with blood-tinged sputum and chest tightness. The patient reports she has been feeling weak over the last week. She saw Dr. Jeananne Rama on a regular scheduled appointment where he checked x-ray, where he told her she has a pneumonia and started her on doxycycline for pneumonia. The patient reports she still has shortness of breath which prompted her to come to the ED. The patient was slightly tachycardic, saturating 92% on room air. Her chest x-ray did not show any infiltrate or pneumonia, but it did show evidence of mild vascular congestion. The patient denies any orthopnea, any worsening lower extremity edema. Did not have any JVD. The patient reports she has been having chest pain left mid sternal, pressure quality over the last week. Worsened by taking a deep breath, with no relieving factor. The patient's troponin was negative. Her EKG did not have any significant ST or T wave abnormalities. Hospitalist service was requested to admit the patient for further evaluation of her shortness of breath.   PAST MEDICAL HISTORY:  1. History of chest pain in the past. Underwent Myoview stress test in January 2013 which was negative.  2. Atrial fibrillation, resolved. Now, the patient is in normal sinus rhythm.   3. Congestive heart failure, chronic diastolic.  4. Hypertension.  5. Diabetes.  6. History of DVT.   7. Osteoarthritis.  8. Chronic lower back pain.  9. Irritable bowel syndrome.  10. Diverticulitis.  11. History of GI bleed in the  past.   PAST SURGICAL HISTORY:  1. Exploratory laparotomy.  2. Bilateral knee surgery.  3. Partial hysterectomy.  4. Cardiac cath  in 2011.   5. Bilateral cataract surgery.   ALLERGIES: THE PATIENT HAS MULTIPLE ALLERGIES, INCLUDING ALTACE, AMOXICILLIN, AVELOX, BENADRYL, CELEBREX, CIPRO, CODEINE, COZAAR, DIOVAN, ENTEX, EVISTA, GLUCOPHAGE, KEFLEX, NORVASC, PAXIL, PENICILLIN, SULFA, TESSALON PERLES, VASOTEC, AS WELL AS ZITHROMAX.   HOME MEDICATIONS:  1. Lorazepam 0.5 mg oral daily as needed.  2. Coreg 25 mg oral 2 times a day.  3. Omeprazole 40 mg oral daily.  4. Isosorbide mononitrate extended release 30 mg oral daily.  5. Vitamin D3 one tablet daily.  6. Vitamin B12 one tablet daily.  7. Zyrtec 10 mg oral daily.  8. Aspirin 81 mg daily.  9. Recently started on doxycycline 100 mg 1 tablet 2 times a day. She has been taking it a total of 3 days.   FAMILY HISTORY: Significant for diabetes and son had history of prostate cancer.   SOCIAL HISTORY: The patient lives at home with her daughter. No tobacco. No alcohol. No drug use.   REVIEW OF SYSTEMS:  CONSTITUTIONAL: The patient reports fatigue, weakness. Denies fever, chills.  EYES: Denies blurry vision, double vision or inflammation.  EARS, NOSE, THROAT: Denies tinnitus, ear pain, hearing loss, epistaxis.  RESPIRATORY: Reports cough with blood-tinged sputum. Reports shortness of breath. Denies any history of COPD or wheezing.   CARDIOVASCULAR: Reports chest tightness. Denies edema, palpitations, syncope.  GASTROINTESTINAL: Denies nausea, vomiting, diarrhea, abdominal pain, hematemesis.  GENITOURINARY: Denies dysuria, hematuria or renal colic.  ENDOCRINE:  Denies polyuria, polydipsia, heat or cold intolerance.  HEMATOLOGY: Denies anemia, easy bruising, bleeding diathesis .  INTEGUMENTARY: Denies acne, rash or skin lesion.  MUSCULOSKELETAL: Denies any swelling, gout, cramps.  NEUROLOGIC: Denies CVA, TIA, dysarthria, epilepsy.   PSYCHIATRIC: Denies anxiety, insomnia or depression.   PHYSICAL EXAMINATION:  VITAL SIGNS: Temperature 97.5, pulse 82, respiratory rate 20,  saturating 98% on 2 liters nasal cannula.  GENERAL: Elderly female, looks comfortable in bed, in no apparent distress.  HEENT: Head is atraumatic, normocephalic. Pupils equal, reactive to light. Pink conjunctivae. Anicteric sclerae. Moist oral mucosa.  NECK: Supple. No thyromegaly. No JVD.  CHEST: Good air entry bilaterally. No wheezing, rales, rhonchi.  CARDIOVASCULAR: S1, S2 heard. No rubs, murmurs or gallops.  ABDOMEN: Soft, nontender, nondistended. Bowel sounds present.  EXTREMITIES: No edema. No clubbing. No cyanosis. Pedal pulses +2 bilaterally.  PSYCHIATRIC: Appropriate affect. Awake, alert x 3. Intact judgment and insight.  NEUROLOGIC: Cranial nerves grossly intact. Motor 5 out of 5. No focal deficits.  MUSCULOSKELETAL: No joint effusion or erythema.  SKIN: Normal skin turgor. Warm and dry.   PERTINENT LABORATORIES: Glucose 195. BNP 696. BUN 11, creatinine 0.84, sodium 133, potassium 3.9, chloride 97. Troponin less than 0.02. White blood cells 7.1, hemoglobin 13.1, hematocrit 39, platelets 160.   IMAGING: Chest x-ray: Cardiac enlargement with pulmonary vascular congestion and developing edema since previous study. Small bilateral pleural effusion. Chest x-ray done on July 23rd showing small left pleural effusion with left  infiltrate. No pulmonary edema.   ASSESSMENT AND PLAN:  1. Shortness of breath: Etiology is unclear. Currently, x-ray does not show any evidence of pneumonia even though the previous one shows possible pneumonia. Etiology might be related to pneumonia versus acute bronchitis versus congestive heart failure as she has evidence of vascular congestion on her x-ray. The patient has multiple allergies. She has tolerated Rocephin well. Will start her on Rocephin for treatment of bronchitis versus pneumonia. At this point, her  congestive heart failure does not appear to be in clinical volume overload but BNP is mildly elevated. Will check echocardiogram in the morning as well. Will avoid starting her on any fluids. Given the fact the patient is having tachycardia, hypoxia and blood-tinged sputum, will need to check CT angiogram to rule out pulmonary embolism, especially with her known history of deep vein thrombosis in the past. Will keep her on p.r.n. oxygen.  2. Chest pain: The patient had negative troponin. No EKG changes. Will give her aspirin. Will cycle cardiac enzymes. Will admit her to telemetry.  3. Congestive heart failure: Will check echocardiogram. Will continue with Coreg. Will hold on any intravenous fluids at this point.  4. Diabetes mellitus: The patient does not appear to be on any, medications. Will check her fingersticks and if needed, we will start her on insulin sliding scale. Will check hemoglobin A1c.   5. Hypertension: Uncontrolled. Will resume her back on Coreg. Will add p.r.n. hydralazine.  6. Deep vein thrombosis prophylaxis: Subcutaneous heparin.   CODE STATUS: The patient reports she does not have a healthcare Living Will. Reports she is a FULL CODE but reports if she has overall poor prognosis with poor life quality, she does not want to be kept on life support.   TOTAL TIME SPENT ON ADMISSION AND PATIENT CARE: 55 minutes.    ____________________________ Albertine Patricia, MD dse:gb D: 12/26/2013 00:19:53 ET T: 12/26/2013 00:40:44 ET JOB#: [id number]  cc: Albertine Patricia, MD, <Dictator> Wilmore Holsomback Graciela Husbands MD ELECTRONICALLY SIGNED 12/26/2013 7:23

## 2014-09-23 NOTE — Consult Note (Signed)
Present Illness Patient is an 81 year old female with history of hypertension and irritable bowel syndrome who presents for evaluation of recurrent syncope.  Patient states that on several occasions while straining at stool and getting up T ambulate she has had syncopal episodes.  The usually preceded by lightheadedness.  She is unaware of whether these events have occurred outside of her irritable bowel syndromes.  During her hospitalization she has remained in sinus rhythm with intermittent a arrhythmia and up to 1.6-2 second pauses.  These were not related to bile disturbance or syncopal episodes.  Echocardiogram revealed preserved left ventricular function with no significant structural abnormalities.  Etiology of her syncope is unclear.  A survey may be related to vasovagal versus Valsalva with her bowel complaints.  Alternatively, the intermittent bradycardia may be playing a role.  Patient is been taken off of her carvedilol.   Physical Exam:  GEN no acute distress   HEENT hearing intact to voice   NECK supple   RESP clear BS   CARD Regular rate and rhythm  Murmur   Murmur Systolic   Systolic Murmur axilla   ABD denies tenderness  no hernia  soft  normal BS   LYMPH negative neck, negative axillae   EXTR negative cyanosis/clubbing, negative edema   SKIN normal to palpation   NEURO cranial nerves intact, motor/sensory function intact   PSYCH A+O to time, place, person   Review of Systems:  Subjective/Chief Complaint Syncope and abdominal discomfort   General: Weakness   Skin: No Complaints   ENT: No Complaints   Eyes: No Complaints   Neck: No Complaints   Respiratory: No Complaints   Cardiovascular: No Complaints   Gastrointestinal: abdominal cramping   Genitourinary: No Complaints   Vascular: No Complaints   Musculoskeletal: No Complaints   Neurologic: No Complaints   Hematologic: No Complaints   Endocrine: No Complaints   Psychiatric: No  Complaints   Review of Systems: All other systems were reviewed and found to be negative   Medications/Allergies Reviewed Medications/Allergies reviewed   Family & Social History:  Family and Social History:  Family History Non-Contributory   Social History negative tobacco   EKG:  EKG NSR   Abnormal NSSTTW changes   Interpretation occasional blocked PACs    Sulfa drugs: Hives  Other- Explain in Comments Line: Hives  Penicillin: Hives  Codeine: Hives  Benadryl: Hives  Cipro: Swelling, SOB, Hives  Protonix: Blurred Vision, Hallucinations, Swelling  Keflex: Unknown  Aluminum Carbonate: Unknown  Sudafed: Unknown  Actos: Unknown  Skelaxin: Unknown  Amoxicillin: Unknown  Norvasc: Unknown  Vasotec: Unknown  Diovan: Unknown  Paxil: Unknown  Cozaar: Unknown  Altace: Unknown  Ziac: Unknown  Celebrex: Unknown  Glucophage: Unknown  Zithromax: Unknown  Entex: Unknown  Avelox: Unknown  Evista: Unknown  Tessalon perles: Unknown   Impression 81 year old female with episodic syncopal episodes.  Most of these occur when she is having a bout of her irritable bowel syndrome.  She has had intermittent brief pauses in the hospital however the is a bit less than 2 seconds did not appear to correlate with her symptoms.  Certainly she may have bradycardia at home.  She has been taken off her carvedilol as done well he during her hospitalization does far.  She does have mildly sclerotic but not significantly stenotic aortic valve.  This does not  appear to be playing a role and this is not critical at present.  Would continue to hold carvedilol and ambulate patient following  for symptoms.   he if she is stable, consider discharge with outpatient follow-up with a Holter monitor in place.  Would place a 48 hr device.   Plan 1. Continue remain off of carvedilol but on the remainder of her medications 2. Ambulate and follow for symptoms 3. Consider discharge if stable with 48 hr Holter  monitor to be placed at time of discharge with follow-up in 1 week in our office   Electronic Signatures: Teodoro Spray (MD)  (Signed 27-Jul-15 13:19)  Authored: General Aspect/Present Illness, History and Physical Exam, Review of System, Family & Social History, EKG , Allergies, Impression/Plan   Last Updated: 27-Jul-15 13:19 by Teodoro Spray (MD)

## 2014-09-23 NOTE — Discharge Summary (Signed)
PATIENT NAME:  Erika Huff, Erika Huff MR#:  [id number] DATE OF BIRTH:  May 25, 1924  DATE OF ADMISSION:  01/19/2014 DATE OF DISCHARGE:  01/20/2014  DISCHARGE DIAGNOSES:  1.  Vasovagal syncope.  2.  Chronic diastolic congestive heart failure, well compensated at this time.  3.  Autonomic neuropathy, likely contributing to her left lower extremity sensory loss.   SECONDARY DIAGNOSES:  1.  Hypertension.  2.  Diastolic congestive heart failure.  3.  History of recurrent syncope.   CONSULTATIONS:  1.  Neurology, Dr. Arna Medici.  2.  Cardiology, Dr. Isaias Cowman, and also Dr. Ida Rogue for a second opinion.   PROCEDURES AND RADIOLOGY: Chest x-ray on August 19 showed no acute cardiopulmonary disease.   CT scan of the head without contrast on August 19 showed atrophy and chronic microvascular ischemia. No acute abnormality.   MAJOR LABORATORY PANEL: Urinalysis on admission showed 1+ bacteria, 242 WBCs, 2+ leukocyte esterase and WBC in clumps.   HISTORY AND SHORT HOSPITAL COURSE: The patient is an 81 year old female with the above-mentioned medical problems who was admitted for syncope. Please see Dr. Zigmund Gottron dictated history and physical for further details. Cardiology consultation was obtained with Dr. Saralyn Pilar who agreed with management including close monitoring on her heart rate on telemetry.  Her recent echo showed hyperdynamic LV function. She was recommended to avoid beta blockers.  The patient's daughter were dictating her care requiring more efforts to explain  the patient's situation and management plan. She demanded a second cardiology opinion, which was requested by different group from [facility], who agreed with ongoing management. They also recommended holding beta blockers and encouraging p.o. liquids. Patient's family also demanded neurological consultation which was requested for Dr. Arna Medici, who felt her symptoms to be vasovagal syncope.  He also felt  patient probably has autonomic neuropathy considering her mild left hemibody sensory loss with severe length dependent sensory loss in her left greater than right leg, which are progressive over several months, and recommended outpatient EMG or nerve conduction study. Her blood pressure was significantly better on August 21, and after discussion with all the consultants including nursing and care management/social worker, the patient was deemed stable enough to be discharged. Physical therapy recommended short-term rehab, although the patient is medically stable for discharge and would not meet any criteria to stay in the hospital at this point. The patient was treated with IV Rocephin for her asymptomatic urinary tract infection, and at this point, she would not need more treatment as she does not have any symptoms for her urinary tract infection, as this would make it more difficult for her and possibly will get side effects of antibiotic.  The urine culture had remained negative with very small colonies anyway, so does not require more treatment. The patient's family members (2 daughters) are not quite happy to take her back home and wanting some definitive answers. Unfortunately, at this point, there is not much acute medical care need an that we can provide at the hospital. She is being discharged. She will be discharged to home with home health services provided by care management, which has already been set up. On the date of discharge, her vital signs were as follows: Temperature 98.3, heart rate 71 per minute, respirations 19 per minute, blood pressure 122/68 mmHg, and she was saturating 96% on room air.   PERTINENT PHYSICAL EXAMINATION ON THE DATE OF DISCHARGE:  CARDIOVASCULAR: S1, S2 normal. No murmurs, rales, or gallop.  LUNGS: Clear to auscultation  bilaterally. No wheezing, rales, rhonchi, or crepitation.  ABDOMEN: Soft, benign.  NEUROLOGIC: Nonfocal examination.   All other physical examination  remained at baseline.   DISCHARGE MEDICATIONS:  cetirizine 10 mg oral tablet  1 tab(s) orally once a day   omeprazole 40 mg oral delayed release capsule  1 cap(s) orally once a day   aspirin enteric coated 81 mg oral delayed release tablet  1 tab(s) orally once a day   zaditor 0.025% ophthalmic solution  1 drop(s) to each affected eye every 8 hours   tums extra strength  1 tab(s) orally once a day   lorazepam 1 mg oral tablet  0.25 tab(s) orally once a day, As Needed   isosorbide mononitrate extended release 60 mg oral tablet, extended release  1 tab(s) orally once a day (in the morning)   vitamin d3 1000 intl units oral tablet  1 tab(s) orally once a day   losartan 25 mg oral tablet  1 tab(s) orally once a day   centrum silver antioxidant multiple vitamins and minerals oral tablet  1 tab(s) orally once a day     DISCHARGE DIET: Low sodium.   DISCHARGE ACTIVITY: As tolerated.   DISCHARGE INSTRUCTIONS AND FOLLOW-UP: The patient was instructed to follow up with her primary care physician, Dr. Golden Pop in 1- 2 weeks. She will need follow-up with [facility] cardiology, Dr. Bartholome Bill in 2-4 weeks and with [facility] neurology in 4-6 weeks.   TOTAL TIME DISCHARGING THIS PATIENT: 55 minutes.  Please note, she remains at very high risk for readmission.    ____________________________ Lucina Mellow. Manuella Ghazi, MD vss:TT D: 01/20/2014 16:27:56 ET T: 01/20/2014 17:41:35 ET JOB#: [id number]  cc: Finneas Mathe S. Manuella Ghazi, MD, <Dictator> Guadalupe Maple, MD [facility] Neurology Cristopher Estimable. Mar Daring, MD Isaias Cowman, MD Javier Docker. Ubaldo Glassing, MD Minna Merritts, MD Remer Macho MD ELECTRONICALLY SIGNED 01/26/2014 13:23

## 2014-09-23 NOTE — Consult Note (Signed)
PATIENT NAME:  Erika Huff, Erika Huff MR#:  [id number] DATE OF BIRTH:  [birth year]  DATE OF CONSULTATION:  01/18/2014  REFERRING PHYSICIAN:  Vivek J. Verdell Carmine, MD CONSULTING PHYSICIAN:  Isaias Cowman, MD  PRIMARY CARE PHYSICIAN: Dr. Jeananne Rama.    CARDIOLOGIST: Bartholome Bill, MD.   CHIEF COMPLAINT: My legs are weak.   HISTORY OF PRESENT ILLNESS: The patient is an 81 year old female who was admitted following an apparent syncopal episode. The patient reports that she felt weakness in her left leg and was planned to see her cardiologist Dr. Ubaldo Glassing for an outpatient evaluation. The patient was at a restaurant eating breakfast with her daughter when she apparently became presyncopal with her head on the table, initially unarousable with verbal commands and sternal rub. EMS arrived, where the patient was noted to be hypotensive with heart rates in the 50s and was brought to [facility] Emergency Room. The patient was given intravenous fluids and admitted to telemetry where she has remained hemodynamically stable in normal sinus rhythm. The patient apparently was hospitalized 2 weeks ago for congestive heart failure, at which time carvedilol was discontinued due to 1 to 2 second pauses. Admission labs were notable for a negative troponin less than 0.02 x 2.   PAST MEDICAL HISTORY: 1.  Diastolic congestive heart failure.  2.  Hypertension.   MEDICATIONS ON ADMISSION: Aspirin 81 mg daily, Imdur 60 mg daily, losartan 25 mg daily, Centrum multivitamin 1 daily, cetirizine 10 mg daily, lorazepam 0.25 mg daily p.r.n., omeprazole 40 mg daily, vitamin D3 at 1000 international units daily.   SOCIAL HISTORY: The patient lives at home with her daughter. She denies tobacco abuse.   FAMILY HISTORY: No immediate family history of coronary artery disease or myocardial infarction.   REVIEW OF SYSTEMS:  CONSTITUTIONAL: No fever or chills.  EYES: No blurry vision.  EARS: No hearing loss.  RESPIRATORY: Mild exertional dyspnea.   CARDIOVASCULAR: No chest pain.  GASTROINTESTINAL: No nausea, vomiting, or diarrhea.  GENITOURINARY: No dysuria or hematuria.  ENDOCRINE: No polyuria or polydipsia.  MUSCULOSKELETAL: No arthralgias or myalgias.  NEUROLOGICAL: No focal muscle weakness or numbness.  PSYCHOLOGICAL: No depression or anxiety.   PHYSICAL EXAMINATION: VITAL SIGNS: Blood pressure 129/63, pulse 63, respirations 16, temperature 97.9, pulse oximetry 97%.  HEENT: Pupils equal, reactive to light and accommodation.  NECK: Supple without thyromegaly.  LUNGS: Clear.  CARDIOVASCULAR: Normal JVP. Normal PMI. Regular rate and rhythm. Normal S1, S2. No appreciable gallop, murmur, or rub.  ABDOMEN: Soft and nontender.  EXTREMITIES: Pulses were intact bilaterally.  MUSCULOSKELETAL: Normal muscle tone.  NEUROLOGIC: The patient appeared to be alert and oriented, was answering questions appropriately. Motor and sensory were both grossly intact.   IMPRESSION: An 81 year old female who presents after apparent syncopal episode with initial hypotension and mild bradycardia, which is resolved after intravenous fluids. The patient has been admitted to telemetry where she has remained clinically stable, with normal blood pressure, and normal sinus rhythm without evidence for symptomatic bradycardia.   RECOMMENDATIONS: 1.  Agree with overall current therapy.  2.  Will continue to follow the patient on telemetry.  3.  At present, there does not appear to be a clear indication for permanent pacemaker implantation.   ____________________________ Isaias Cowman, MD ap:at D: 01/18/2014 16:40:11 ET T: 01/18/2014 17:31:06 ET JOB#: [id number]  cc: Isaias Cowman, MD, <Dictator> Isaias Cowman MD ELECTRONICALLY SIGNED 02/02/2014 15:59

## 2014-09-23 NOTE — H&P (Signed)
PATIENT NAME:  Erika Huff, Erika Huff MR#:  [id number] DATE OF BIRTH:  [birth year]  DATE OF ADMISSION:  01/18/2014   PRIMARY CARE PHYSICIAN: Guadalupe Maple, MD  CARDIOLOGIST: Javier Docker. Ubaldo Glassing, MD  CHIEF COMPLAINT: Syncope.   HISTORY OF PRESENT ILLNESS: This is an 81 year old female who presents to the hospital due to a syncopal episode witnessed at a restaurant by her daughter. As per the daughter, the patient says that, while she was having breakfast this morning, she said she did not feel well. The patient's daughter went to pay the bill, and she found down, with her head down on the table drooling. She was unable to arouse her with verbal commands or even a sternal rub. The patient was breathing, but she was sort of unresponsive. EMS was called. When EMS arrived, the patient was noted to be hypotensive with blood pressures in the 80s. She was also bradycardic with heart rates in the 50s. She was therefore brought to the Emergency Room for further evaluation.   After getting some IV fluids, the patient's blood pressures have improved. Her heart rate still remains in the high 50s to low 60s.   The patient recently was hospitalized about 2 weeks ago for CHF and had a syncopal episode while in the hospital. She was on Coreg, and was taken off of it due to 1-2 second pauses while in the hospital. She was actually sent home with a 48-hour Holter monitor. The results of the Holter monitor are not back yet.   Yesterday, as per the daughter, the patient checked her blood pressure and pulse, and she was actually tachycardic and hypertensive with systolic blood pressure in the 180s. They called Dr. Bethanne Ginger office, and he advised that she take her Coreg. The daughter informed them that she was taken off the Coreg, but she was told to still take the Coreg at that point. She took 2 doses of Coreg since yesterday, and now presents with the above complaints as mentioned.   REVIEW OF SYSTEMS:  CONSTITUTIONAL: No  documented fever. Positive weakness. No weight gain. No weight loss. EYES: No blurred or double vision.  ENT: No tinnitus. No postnasal drip. No redness of the oropharynx.  RESPIRATORY: No cough, no wheeze, no hemoptysis, no dyspnea.  CARDIOVASCULAR: No chest pain. No orthopnea or palpitations. Positive syncope.  GASTROINTESTINAL: No nausea, no vomiting, no diarrhea, no abdominal pain. No melena or hematochezia.  GENITOURINARY: No dysuria or hematuria.  ENDOCRINE: No polyuria or nocturia, heat or cold intolerance.  HEMATOLOGIC: No anemia, no bruising, no bleeding.  INTEGUMENTARY: No rashes, no lesions.  MUSCULOSKELETAL: No arthritis. No swelling. No gout.  NEUROLOGIC: No numbness, tingling. No ataxia. No seizure-type activity.  PSYCHIATRIC: No anxiety, no insomnia. No ADD.   PAST MEDICAL HISTORY: Consistent with: Hypertension, history of diastolic CHF, previous syncope, history of allergic rhinitis.   ALLERGIES: MULTIPLE DRUGS, INCLUDING ACTOS, ALTACE, AMOXICILLIN, AVELOX, BENADRYL, CELEBREX, CIPRO, CODEINE, COZAAR, DIOVAN, EVISTA, GLUCOPHAGE, KEFLEX, NORVASC, PAXIL, SKELAXIN, SUDAFED, TESSALON PERLES, VASOTEC, ZIAC AND ZITHROMAX.   SOCIAL HISTORY: No smoking. No alcohol abuse. No illicit drug abuse. Lives at home with her daughter.   FAMILY HISTORY: Mother and father are both deceased. Mother died from old age; she was also diabetic. Father died from lung cancer. She has a sister who died from complications of diabetes.   CURRENT MEDICATIONS: As follows: Aspirin 81 mg daily, Centrum multivitamin daily, cetirizine 10 mg daily, Imdur 60 mg daily, lorazepam 0.25 mg daily as needed, losartan 25 mg  daily, omeprazole 40 mg daily, TUMS extra strength as needed, vitamin D3 1000 international units daily, Zaditor 0.025% ophthalmic solution every 8 hours.   PHYSICAL EXAMINATION:  VITAL SIGNS: Temperature 97.5, pulse 62, respirations 12, blood pressure 95/64, saturations 99% on room air.  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  HEAD, EYES, EARS, NOSE AND THROAT: Atraumatic, normocephalic. Extraocular muscles are intact. Pupils reactive to light. Sclerae anicteric. No conjunctival injection. No pharyngeal erythema.  NECK: Supple. There is no jugular venous distention. No bruits. No lymphadenopathy or thyromegaly.  HEART: Regular rate and rhythm. No murmurs, no rubs, no clicks.  LUNGS: Clear to auscultation bilaterally. No rales, no rhonchi, no wheezes.  ABDOMEN: Soft, flat, nontender, nondistended. Has good bowel sounds. No hepatosplenomegaly appreciated.  EXTREMITIES: No evidence of any cyanosis, clubbing or peripheral edema. Has +2 pedal and radial pulses bilaterally.  NEUROLOGICAL: The patient is alert, awake, and oriented x 2. No focal motor or sensory deficits appreciated bilaterally.  SKIN: Moist and warm with no rashes appreciated.  LYMPHATIC: There is no cervical or axillary lymphadenopathy.   LABORATORY DATA: Serum glucose of 171, BUN 17, creatinine 1.05, sodium 136, potassium 4.1, chloride 100, bicarbonate 28. The patient's LFTs are within normal limits. Troponin less than 0.02. White cell count 6.8, hemoglobin 11.6, hematocrit 35.1, platelet count 177,000. INR is 1.1.   The patient did have a chest x-ray done, which showed no acute cardiopulmonary disease. The patient also had a CT of the head done without contrast, which showed atrophy with chronic microvascular ischemia. No acute abnormality.   ASSESSMENT AND PLAN: This is an 81 year old female with a history of diastolic congestive heart failure, hypertension, history of allergic rhinitis and previous syncope, who presents to the hospital due to a syncopal episode and noted to be hypotensive.  1.  Syncope. I suspect this is likely cardiogenic syncope. The patient was recently in the hospital 2 weeks ago and had a syncopal episode while in the hospital, and was noted to have 1-2 second pauses on  the monitor. She was on Coreg at that time, and was taken off. She had a 48-hour Holter monitor, which she was sent home with; the results of which are still pending. For now, I will observe her on telemetry, watch for any arrhythmias, follow serial cardiac markers. I will not repeat an echocardiogram, as the patient had one 2 weeks ago, which was essentially normal. Her CT head is negative, there for this is unlikely neurogenic syncope. I will get a cardiology consult as I suspect this is likely sick sinus syndrome, and the patient may likely need a pacemaker.  2.  Hypotension. This was likely pharmacologic in nature. The patient was taken off the Coreg as mentioned 2 weeks ago, but she took an additional 1 or 2 doses the past the day or so, as she was hypertensive and tachycardic. Presently, I will continue IV fluids, follow hemodynamics. There is no evidence of septic or cardiogenic shock. Hold all her antihypertensives for now, and follow hemodynamics.  3.  Allergic rhinitis. Continue with her cetirizine and Zaditor drops. 4.  History of congestive heart failure; no acute issue. The patient is clinically not in congestive heart failure presently.   CODE STATUS: The patient is a Full Code.   TIME SPENT: 50 minutes    ____________________________ Belia Heman. Verdell Carmine, MD vjs:MT D: 01/18/2014 11:12:09 ET T: 01/18/2014 11:32:07 ET JOB#: [id number]  cc: Belia Heman. Verdell Carmine, MD, <Dictator> [place]  MD ELECTRONICALLY SIGNED 01/28/2014 11:46

## 2014-09-23 NOTE — Discharge Summary (Signed)
PATIENT NAME:  Erika Huff, Erika Huff MR#:  [id number] DATE OF BIRTH:  1924-03-31  DATE OF ADMISSION:  12/26/2013 DATE OF DISCHARGE:  12/27/2013  ADMISSION DIAGNOSIS: Shortness of breath.   DISCHARGE DIAGNOSES:  1.  Shortness of breath secondary to acute diastolic dysfunction.  2.  Syncope, likely vasovagal.  3.  History of hypertension.   CONSULTATIONS: Dr. Ubaldo Glassing.   PERTINENT LABORATORIES AT DISCHARGE: Troponins were negative.   MRI of the brain showed no acute intracranial hemorrhage or CVA.   A 2D echocardiogram showed an ejection fraction of 70%- 75% with mild to moderate mild aortic regurgitation, mild aortic regurgitation, mild to moderate aortic valve sclerosis and calcification. No evidence of aortic stenosis.   HOSPITAL COURSE:  An 81 year old female who presented with shortness of breath, found to have an elevated BNP and chest x-ray consistent with pulmonary edema. For further details, please refer to the H and P.   1.  Shortness of breath secondary to acute diastolic dysfunction, as mentioned below.   She obtained a CT of the chest, which is negative for pulmonary emboli.  2.  Acute diastolic heart failure. The patient's echo showed an ejection fraction of 70%, no major valvular abnormalities. Her heart failure was secondary to as mentioned diastolic distention. She was placed on Lasix. She is no crackles on lung exam. This morning she diuresed well.   3.  Chest pain, possibly from congestive heart failure.  No PE on CAT scan. Improved with Tylenol.   4.  Her cardiac enzymes are negative.   5.  Hypertension. Patient to continue outpatient medication with the exception of Coreg, because she did have some sinus pause lasting 1-2 seconds.   6.  Syncope. Cardiology was consulted regarding her 1-2 second pauses. We stopped the Coreg, actually this helped. Her syncope is not related to any arrhythmia, but she will be discharged with a 48-hour Holter monitor. She will remain off of her  Coreg and most likely her syncope is vasovagal in nature.   7.  Urinary tract infection. The patient was placed on antibiotics.    DISCHARGED MEDICATIONS: 1. Cetrizine10 mg daily.  2.  Omeprazole 40 mg daily. 3.  Ativan 0.5 mg daily.  4.  Imdur 30 mg daily.  5.  Aspirin 81 mg daily.  6.  Vitamin D3 1 tablet daily.  7.  Vitamin B12 1 tablet daily.  8.  Keflex 500 mg p.o. t.i.d. x 7 days.   DISCHARGE HOME HEALTH: With physical therapy, nurse, and nurse aide for assessment and gait.   DISCHARGE DIET: Low sodium.   DISCHARGE ACTIVITY: As tolerated.   DISCHARGE FOLLOW-UP: The patient will follow-up with Golden Pop, MD, in 1 week and Dr. Ubaldo Glassing in 1 week.   The patient will be set up for a Holter monitor.   TIME SPENT: Approximately 35 minutes.  CONDITION:  Patient is stable for discharge.    ____________________________ Courney Garrod P. Benjie Karvonen, MD spm:ts D: 12/27/2013 12:18:21 ET T: 12/27/2013 13:18:30 ET JOB#: [id number]  cc: Dmonte Maher P. Benjie Karvonen, MD, <Dictator> Javier Docker. Ubaldo Glassing, MD Guadalupe Maple, MD Donell Beers Cannan Beeck MD ELECTRONICALLY SIGNED 12/28/2013 14:56

## 2014-09-23 NOTE — Consult Note (Signed)
Referring Physician:  Henreitta Leber   Primary Care Physician:  Chauncey Cruel, [address], [place], [place] [address], [phone]  Reason for Consult: Admit Date: 18-Jan-2014  Chief Complaint: "I keep passing out and it keeps getting worse and worse"  Reason for Consult: Syncope   History of Present Illness: History of Present Illness:   Erika Huff is an 81 yo woman with PMH notable for syncope, diastolic CHF, and hypertension who presents after losing consciousness at home. She has been admitted several times in the past year for this complaint. On this admission, she was found to be hypotensive and bradycardic on presentation which was corrected with IV fluids and resulted in symptomatic improvement. patient has no memory for these events. The last thing she remembers is having a bowel movement on the toilet and then attempting to stand up. She later regained consciousness on her bed, next to her bathroom. Her family notes that they believe she was unconscious for about 20 minutes, and seemed sleepy and disoriented when they found her. She did not have any tongue-biting, incontinence, or convulsive activity. patient also complains of left leg numbness that has been progressively worsening over the last 3 months.  Past Medical/Surgical Hx:  vericose veins:   "trigger thumb":   Hypertension:   Diabetes:   Cardiac Cath:   Cataract Extraction:   Knee Surgery - Right:   Knee Surgery - Left:   Laparoscopy:   Cholecystectomy:   Hysterectomy - Partial:   Home Medications: Medication Instructions Last Modified Date/Time  cetirizine 10 mg oral tablet 1 tab(s) orally once a day 19-Aug-15 11:14  omeprazole 40 mg oral delayed release capsule 1 cap(s) orally once a day 19-Aug-15 11:14  Aspirin Enteric Coated 81 mg oral delayed release tablet 1 tab(s) orally once a day 19-Aug-15 11:14  Zaditor 0.025% ophthalmic solution 1 drop(s) to each affected eye every 8  hours 19-Aug-15 11:14  Tums Extra Strength 1 tab(s) orally once a day 19-Aug-15 11:14  LORazepam 1 mg oral tablet 0.25 tab(s) orally once a day, As Needed 19-Aug-15 11:14  isosorbide mononitrate extended release 60 mg oral tablet, extended release 1 tab(s) orally once a day (in the morning) 19-Aug-15 11:14  Vitamin D3 1000 intl units oral tablet 1 tab(s) orally once a day 19-Aug-15 11:14  losartan 25 mg oral tablet 1 tab(s) orally once a day 19-Aug-15 11:14  Centrum Silver Antioxidant Multiple Vitamins and Minerals oral tablet 1 tab(s) orally once a day 19-Aug-15 11:14   Allergies:  Sulfa drugs: Hives  Other- Explain in Comments Line: Hives  Penicillin: Hives  Codeine: Hives  Benadryl: Hives  Cipro: Swelling, SOB, Hives  Protonix: Blurred Vision, Hallucinations, Swelling  Keflex: Unknown  Aluminum Carbonate: Unknown  Sudafed: Unknown  Actos: Unknown  Skelaxin: Unknown  Amoxicillin: Unknown  Norvasc: Unknown  Vasotec: Unknown  Diovan: Unknown  Paxil: Unknown  Cozaar: Unknown  Altace: Unknown  Ziac: Unknown  Celebrex: Unknown  Glucophage: Unknown  Zithromax: Unknown  Entex: Unknown  Avelox: Unknown  Evista: Unknown  Tessalon perles: Unknown  Social/Family History: Social History: Lives at home with her daughter. Denies smoking, EtOH, or illicit drug use.  Family History: History of diabetes on both her mothers' and fathers' side.   Vital Signs: **Vital Signs.:   20-Aug-15 08:19  Vital Signs Type Routine  Temperature Temperature (F) 97.6  Celsius 36.4  Pulse Pulse 70  Respirations Respirations 16  Systolic BP Systolic BP 751  Diastolic BP (  mmHg) Diastolic BP (mmHg) 248  Mean BP 141  Pulse Ox % Pulse Ox % 95  Pulse Ox Activity Level  At rest   EXAM: Well-developed, well-nourished, in NAD. No conjunctival injection or scleral edema. Oropharynx clear. No carotid bruits auscultated. Normal S1, S2 and regular cardiac rhythm on exam. Lungs clear to auscultation  bilaterally. Abdomen soft and nontender. Peripheral pulses palpated. No clubbing, cyanosis, or edema in extremities.  MENTAL STATUS: Alert and oriented to person, place, and time. Language fluent and appropriate. Cognition and memory conversationally intact. CRANIAL NERVES: Visual fields full to confrontation. PERRL. EOMI. Facial sensation decreased to pinprick in left V1,2,3. Facial muscles full and symmetric. Hearing markedly reduced bilaterally to finger rub. Uvula midline with symmetric palatal elevation. Tongue midline without fasciculations. MOTOR: Normal bulk and tone. Strength 5/5 in deltoids, biceps, triceps, wrist flexors and extensors, and hand intrinsics bilaterally. Strength 5/5 in iliopsoas, glutes, hamstrings, quads, and tib ant bilaterally. REFLEXES: Trace in biceps, triceps, and absent in patella and achilles bilaterally. Extensor plantar responses bilaterally. SENSORY: Slightly diminished to pinprick in left hemibody. Superimposed upon this, there is a severe length-dependent loss of sensation to vibration and pinprick in the left leg below the knee, and milder in the right leg below the ankle. COORDINATION: No ataxia or dysmetria on finger-nose. GAIT: Not evaluated.  Lab Results: Rhogam:  19-Aug-15 10:48   Notification PREVIOUSLY NOTIFIED  Notification Date NOT APPLICABLE  Notification Time NOT APPLICABLE  Result(s) reported on 18 Jan 2014 at 03:36PM.  Hepatic:  19-Aug-15 09:12   Bilirubin, Total 0.6  Alkaline Phosphatase 77 (46-116 NOTE: New Reference Range 12/20/13)  SGPT (ALT)  11 (14-63 NOTE: New Reference Range 12/20/13)  SGOT (AST) 25  Total Protein, Serum 7.3  Albumin, Serum  3.2  Routine BB:  19-Aug-15 09:45   ABO Group + Rh Type O Negative  Antibody Screen POSITIVE (Result(s) reported on 18 Jan 2014 at 10:48AM.)    10:48   Antibody 1 Anti- D  Routine Chem:  20-Aug-15 04:25   Glucose, Serum  129  BUN 15  Creatinine (comp) 0.83  Sodium, Serum 138   Potassium, Serum 3.8  Chloride, Serum 103  CO2, Serum 27  Calcium (Total), Serum 8.6  Anion Gap 8  Osmolality (calc) 278  eGFR (African American) >60  eGFR (Non-African American) >60 (eGFR values <66m/min/1.73 m2 may be an indication of chronic kidney disease (CKD). Calculated eGFR is useful in patients with stable renal function. The eGFR calculation will not be reliable in acutely ill patients when serum creatinine is changing rapidly. It is not useful in  patients on dialysis. The eGFR calculation may not be applicable to patients at the low and high extremes of body sizes, pregnant women, and vegetarians.)  Cardiac:  19-Aug-15 17:30   Troponin I < 0.02 (0.00-0.05 0.05 ng/mL or less: NEGATIVE  Repeat testing in 3-6 hrs  if clinically indicated. >0.05 ng/mL: POTENTIAL  MYOCARDIAL INJURY. Repeat  testing in 3-6 hrs if  clinically indicated. NOTE: An increase or decrease  of 30% or more on serial  testing suggests a  clinically important change)  Routine UA:  19-Aug-15 11:21   Color (UA) Amber  Clarity (UA) Cloudy  Glucose (UA) Negative  Bilirubin (UA) Negative  Ketones (UA) Negative  Specific Gravity (UA) 1.020  Blood (UA) Negative  pH (UA) 5.0  Protein (UA) 30 mg/dL  Nitrite (UA) Negative  Leukocyte Esterase (UA) 2+ (Result(s) reported on 18 Jan 2014 at 11:51AM.)  RBC (UA) 3 /HPF  WBC (UA) 242 /HPF  Bacteria (UA) 1+  Epithelial Cells (UA) 1 /HPF  WBC Clump (UA) PRESENT  Mucous (UA) PRESENT  Hyaline Cast (UA) 13 /LPF (Result(s) reported on 18 Jan 2014 at 11:51AM.)  Routine Coag:  19-Aug-15 09:12   Activated PTT (APTT) 26.4 (A HCT value >55% may artifactually increase the APTT. In one study, the increase was an average of 19%. Reference: "Effect on Routine and Special Coagulation Testing Values of Citrate Anticoagulant Adjustment in Patients with High HCT Values." American Journal of Clinical Pathology 2006;126:400-405.)  Prothrombin 14.1  INR 1.1 (INR  reference interval applies to patients on anticoagulant therapy. A single INR therapeutic range for coumarins is not optimal for all indications; however, the suggested range for most indications is 2.0 - 3.0. Exceptions to the INR Reference Range may include: Prosthetic heart valves, acute myocardial infarction, prevention of myocardial infarction, and combinations of aspirin and anticoagulant. The need for a higher or lower target INR must be assessed individually. Reference: The Pharmacology and Management of the Vitamin K  antagonists: the seventh ACCP Conference on Antithrombotic and Thrombolytic Therapy. ONGEX.5284 Sept:126 (3suppl): [id number]. A HCT value >55% may artifactually increase the PT.  In one study,  the increase was an average of 25%. Reference:  "Effect on Routine and Special Coagulation Testing Values of Citrate Anticoagulant Adjustment in Patients with High HCT Values." American Journal of Clinical Pathology 2006;126:400-405.)  Routine Hem:  20-Aug-15 04:25   WBC (CBC) 7.4  RBC (CBC) 3.97  Hemoglobin (CBC) 12.1  Hematocrit (CBC) 36.3  Platelet Count (CBC) 153  MCV 91  MCH 30.4  MCHC 33.3  RDW 13.4  Neutrophil % 60.9  Lymphocyte % 29.2  Monocyte % 5.1  Eosinophil % 3.6  Basophil % 1.2  Neutrophil # 4.5  Lymphocyte # 2.2  Monocyte # 0.4  Eosinophil # 0.3  Basophil # 0.1 (Result(s) reported on 19 Jan 2014 at [facility].)   Radiology Results: CT:    19-Aug-15 09:35, CT Head Without Contrast  CT Head Without Contrast   REASON FOR EXAM:    CVA  COMMENTS:   May transport without cardiac monitor    PROCEDURE: CT  - CT HEAD WITHOUT CONTRAST  - Jan 18 2014  9:35AM     CLINICAL DATA:  Code stroke.  Left-sided weakness.    EXAM:  CT HEAD WITHOUT CONTRAST    TECHNIQUE:  Contiguous axial images were obtained from the base of the skull  through the vertex without intravenous contrast.    COMPARISON:  MRI 12/26/2013  FINDINGS:  Generalized atrophy. Chronic  microvascular ischemia in the white  matter.    Negative for acuteinfarct. Negative for hemorrhage or mass.  Calvarium intact.     IMPRESSION:  Atrophy and chronic microvascular ischemia.  No acute abnormality.    Critical Value/emergent results were called by telephone at the time  of interpretation on 01/18/2014 at 9:44 am to Dr. Delman Kitten , who  verbally acknowledged these results.    Electronically Signed    By: Franchot Gallo M.D.    On: 01/18/2014 09:44         Verified By: Truett Perna, M.D.,   Impression/Recommendations: Recommendations:   Erika Huff is an 81 yo woman with a history of repeated syncopal episodes. The current episode was precipitated by standing up shortly after having a bowel movement, which is most concerning for vasovagal syncope. The reportedly prolonged period of unconsciousness afterwards is not typical, though no other historical elements point  to cerebrovascular ischemia as the cause. She was bradycardiac and hypotensive on presentation, further implicating a vasovagal response. Her neurologic exam reveals mild left hemi body sensory loss with severe length-dependent sensory loss in his left greater than right leg. These latter symptoms have been progressive over several months and are concerning for a neuropathic process. The left face/arm/leg mild sensory loss is possibly due to a chronic infarct, though is not expected to play a role in her syncope.  believe she has an element of neuropathy which be also be having autonomic consequences and could potentially be contributing to her syncopal episodes. I would recommend further workup of this with EMG and NCS soon as an outpatient. you for the opportunity to participate in Erika Huff's care. Please page Neurology with further questions. Mar Daring, MD   Electronic Signatures: Carmin Richmond (MD)  (Signed 20-Aug-15 14:43)  Authored: REFERRING PHYSICIAN, Primary Care Physician, Consult, History of  Present Illness, PAST MEDICAL/SURGICAL HISTORY, HOME MEDICATIONS, ALLERGIES, Social/Family History, NURSING VITAL SIGNS, Physical Exam-, LAB RESULTS, RADIOLOGY RESULTS, Recommendations   Last Updated: 20-Aug-15 14:43 by Carmin Richmond (MD)

## 2014-09-23 NOTE — Consult Note (Signed)
General Aspect PCP: Justice Rocher, MD Cardiologist: K. Ubaldo Glassing, MD _____________  81 y/o female with a h/o HTN, diast CHF, PAF (06/2011), and chest pain (neg MV 06/2011), who was admitted yesterday following a syncopal spell. ____________  Past Medical History  1.  Syncope      a. 11/2013 Echo: EF 70-75%, mild to mod MR, mildly dil LA, mild AI, mild-mod Ao Sclerosis w/w stenosis. 2.  Chronic Diastolic CHF      a. 0/9233 Echo: EF 70-75%. 3.  H/O Chest Pain      a. H/O Cath in [place] ~ 2009 - dtr says minor blockage-->Med Rx.      b. 06/2011 MV: fixed inferior defect, no ischemia, EF 72%-->Med Rx.      4.  H/O PAF       a. Previously on coumadin - was taken off by cardiologist in [place] ~ 2009. 5.  HTN      a. 05/2012 admitted with malignant HTN and stroke-like Ss. 6.  OA 7.  Chronic LBP with radiculopathy to L leg 8.  Cataracts      a. s/p L cataracts surgery. 9.  Vertigo 10.  H/O DVT 11.  IBS 12.  H/O Diverticulosis with prior diverticular bleed _____________   Present Illness 81 y/o female with the above problem list.   She has a h/o chest pain and per dtr, non-obstructive CAD by cath in [place] ~ 2009.  She also has a reported h/o PAF and was on coumadin for several yrs before being taken off following catheterization in 2009.  She also has a long h/o "black out" spells - sometimes lasting hours at a time per dtr.  Prior episodes seemed to be common following straining for a BM.  She was admitted to [facility] in 05/2012 with stroke-like Ss in the setting of malignant HTN and w/u @ that time was unrevealing.  She was recently admitted to [facility] in July following a syncopal episode.  Echo showed hyperdynamic LV fxn with no significant valvular abnormalities.  On tele, she was noted to have brief sub-2 second pauses.  Her home dose of carvedilol was discontinued and she was d/c'd with plan for a 48 hr holter, which reportedly did not show any evidence of high grade heart block or SSS.  Off of coreg, her BP  has been elevated and on 8/18, her family contacted her doctor and was advised to resume coreg that evening and also to take another dose in the morning.  Pt did as instructed.  Yesterday morning while out for bfast with her dtr, she reported feeling poorly.  Her dtr got up to pay the bill and upon returning to her mother, she found her unresponsive with her head on the table.  EMS was called and pt was found to be hypotensive with HRs in the 50's.  She was taken to the ED and hydrated with BP improvement.  Exact duration of unresponsiveness unclear.  Since admission, she has r/o for MI.  ECG non-acute.  Head CT and CXR non-acute.  No significant pauses (longest 1.86) or evidence of high grade heart block on tele.  She has been followed by [facility] and we have been asked to see by IM for second opinion at family's request.   Physical Exam:  GEN well developed, well nourished, no acute distress, pleasant   HEENT pink conjunctivae, moist oral mucosa   NECK supple  no jvd.  radiated aortic murmur on right.   RESP normal  resp effort  clear BS   CARD Regular rate and rhythm  Normal, S1, S2  2/6 SEM RUSB   ABD denies tenderness  soft  normal BS  mild LLQ tenderness.   LYMPH negative neck   EXTR negative cyanosis/clubbing, negative edema   SKIN normal to palpation   NEURO cranial nerves intact, motor/sensory function intact   PSYCH alert, A+O to time, place, person   Review of Systems:  Subjective/Chief Complaint nausea, "passing out spells"   General: No Complaints   Skin: No Complaints   ENT: No Complaints   Eyes: No Complaints   Neck: No Complaints   Respiratory: No Complaints   Cardiovascular: syncope   Gastrointestinal: No Complaints   Genitourinary: No Complaints   Vascular: No Complaints   Musculoskeletal: chronic lbp with left leg radiculopathy   Neurologic: numbness of left lower ext from knee down.   Hematologic: No Complaints   Endocrine: No  Complaints   Psychiatric: No Complaints   Review of Systems: All other systems were reviewed and found to be negative   Medications/Allergies Reviewed Medications/Allergies reviewed   Family & Social History:  Family and Social History:  Family History Negative  no premature CAD   Social History negative tobacco, negative ETOH, negative Illicit drugs   Place of Living Home     vericose veins:    "trigger thumb":    Hypertension:    Diabetes:    Cardiac Cath: 2011   Cataract Extraction:    Knee Surgery - Right:    Knee Surgery - Left:    Laparoscopy:    Cholecystectomy:    Hysterectomy - Partial:    death of child: patient shared about her sadness in remembering her daughter who passed away three years ago in tbe ED here at [facility] and also the death of a doctor who worked here four weeks after her daughter died, 79       Admit Diagnosis:   SYNCOPE: Onset Date: 19-Jan-2014, Status: Active, Description: SYNCOPE  Home Medications: Medication Instructions Status  cetirizine 10 mg oral tablet 1 tab(s) orally once a day Active  omeprazole 40 mg oral delayed release capsule 1 cap(s) orally once a day Active  Aspirin Enteric Coated 81 mg oral delayed release tablet 1 tab(s) orally once a day Active  Zaditor 0.025% ophthalmic solution 1 drop(s) to each affected eye every 8 hours Active  Tums Extra Strength 1 tab(s) orally once a day Active  LORazepam 1 mg oral tablet 0.25 tab(s) orally once a day, As Needed Active  isosorbide mononitrate extended release 60 mg oral tablet, extended release 1 tab(s) orally once a day (in the morning) Active  Vitamin D3 1000 intl units oral tablet 1 tab(s) orally once a day Active  losartan 25 mg oral tablet 1 tab(s) orally once a day Active  Centrum Silver Antioxidant Multiple Vitamins and Minerals oral tablet 1 tab(s) orally once a day Active   Lab Results:  Routine Chem:  20-Aug-15 04:25   Glucose, Serum  129  BUN 15  Creatinine  (comp) 0.83  Sodium, Serum 138  Potassium, Serum 3.8  Chloride, Serum 103  CO2, Serum 27  Calcium (Total), Serum 8.6  Anion Gap 8  Osmolality (calc) 278  eGFR (African American) >60  eGFR (Non-African American) >60 (eGFR values <35m/min/1.73 m2 may be an indication of chronic kidney disease (CKD). Calculated eGFR is useful in patients with stable renal function. The eGFR calculation will not be reliable in acutely ill patients when serum  creatinine is changing rapidly. It is not useful in  patients on dialysis. The eGFR calculation may not be applicable to patients at the low and high extremes of body sizes, pregnant women, and vegetarians.)  Routine Hem:  20-Aug-15 04:25   WBC (CBC) 7.4  RBC (CBC) 3.97  Hemoglobin (CBC) 12.1  Hematocrit (CBC) 36.3  Platelet Count (CBC) 153  MCV 91  MCH 30.4  MCHC 33.3  RDW 13.4  Neutrophil % 60.9  Lymphocyte % 29.2  Monocyte % 5.1  Eosinophil % 3.6  Basophil % 1.2  Neutrophil # 4.5  Lymphocyte # 2.2  Monocyte # 0.4  Eosinophil # 0.3  Basophil # 0.1 (Result(s) reported on 19 Jan 2014 at 05:05AM.)   EKG:  EKG Interp. by me   Interpretation EKG shows rsr, 62, no acute st/t changes.   Radiology Results: XRay:    19-Aug-15 10:03, Chest Portable Single View  Chest Portable Single View   REASON FOR EXAM:    CVA  COMMENTS:       PROCEDURE: DXR - DXR PORTABLE CHEST SINGLE VIEW  - Jan 18 2014 10:03AM     CLINICAL DATA:  CVA    EXAM:  PORTABLE CHEST - 1 VIEW    COMPARISON:  12/25/2013    FINDINGS:  Improved aeration in the lung bases since the prior study. Lungs are  now clear. Negative for infiltrate effusion. Negative for heart  failure.     IMPRESSION:  No active disease.      Electronically Signed    By: Franchot Gallo M.D.    On: 01/18/2014 10:26         Verified By: Truett Perna, M.D.,  CT:    19-Aug-15 09:35, CT Head Without Contrast  CT Head Without Contrast   REASON FOR EXAM:    CVA  COMMENTS:   May  transport without cardiac monitor    PROCEDURE: CT  - CT HEAD WITHOUT CONTRAST  - Jan 18 2014  9:35AM     CLINICAL DATA:  Code stroke.  Left-sided weakness.    EXAM:  CT HEAD WITHOUT CONTRAST    TECHNIQUE:  Contiguous axial images were obtained from the base of the skull  through the vertex without intravenous contrast.    COMPARISON:  MRI 12/26/2013  FINDINGS:  Generalized atrophy. Chronic microvascular ischemia in the white  matter.    Negative for acuteinfarct. Negative for hemorrhage or mass.  Calvarium intact.     IMPRESSION:  Atrophy and chronic microvascular ischemia.  No acute abnormality.    Critical Value/emergent results were called by telephone at the time  of interpretation on 01/18/2014 at 9:44 am to Dr. Delman Kitten , who  verbally acknowledged these results.    Electronically Signed    By: Franchot Gallo M.D.    On: 01/18/2014 09:44         Verified By: Truett Perna, M.D.,    Sulfa drugs: Hives  Other- Explain in Comments Line: Hives  Penicillin: Hives  Codeine: Hives  Benadryl: Hives  Cipro: Swelling, SOB, Hives  Protonix: Blurred Vision, Hallucinations, Swelling  Keflex: Unknown  Aluminum Carbonate: Unknown  Sudafed: Unknown  Actos: Unknown  Skelaxin: Unknown  Amoxicillin: Unknown  Norvasc: Unknown  Vasotec: Unknown  Diovan: Unknown  Paxil: Unknown  Cozaar: Unknown  Altace: Unknown  Ziac: Unknown  Celebrex: Unknown  Glucophage: Unknown  Zithromax: Unknown  Entex: Unknown  Avelox: Unknown  Evista: Unknown  Tessalon perles: Unknown  Vital Signs/Nurse's Notes: **Vital  Signs.:   20-Aug-15 08:19  Vital Signs Type Routine  Temperature Temperature (F) 97.6  Celsius 36.4  Pulse Pulse 70  Respirations Respirations 16  Systolic BP Systolic BP 381  Diastolic BP (mmHg) Diastolic BP (mmHg) 017  Mean BP 141  Pulse Ox % Pulse Ox % 95  Pulse Ox Activity Level  At rest  *Intake and Output.:   Daily 20-Aug-15 07:00  Grand Totals Intake:   640 Output:  600    Net:  40 24 Hr.:  40  Oral Intake      In:  240  IV (Primary)      In:  400  Urine ml     Out:  600  Length of Stay Totals Intake:  640 Output:  600    Net:  40    Impression 1.  Syncope:  Pt admitted yesterday following syncopal episode after eating bfast.  She reportedly felt poorly before event but does not recall losing consciousness.  She was found to be hypotensive by EMS with HR's in the 50's.  She had taken a dose of coreg that morning and also the night before.   Since admission, her BP has come up and she is not hypertensive.  She has a h/o labile HTN, which appears difficult to treat in the setting of multiple drug allergies and propensity for syncope. Prior w/u has included echo in July - hyperdynamic LV fxn. Tele and 48 hr holter - sub 2 second pauses w/o evidence of high grade heart block or symptomatic SSS. Neuro eval ongoing. Etiology unclear at this point.  ? vasovagal, labile HTN/hypotension --Could consider 30 day event monitor, Dr. Ubaldo Glassing could order as an outpt, delivered to her house Would trun BP high to avoid hypotensive episodes.  Avoid B-blockers.  --Discussed this at length with both the dtr in the room and dtr in [place] via phone.  2.  Labile HTN:   Difficult to control in setting of multiple drug intolerances and propensity for hypotension. Currently on hydralazine.   --Check orthostatics. avoid b-blockers, encourage po fluids Dr. Ubaldo Glassing could order ambulatiry BP measurement   Electronic Signatures: Rogelia Mire (NP)  (Signed 20-Aug-15 13:35)  Authored: General Aspect/Present Illness, History and Physical Exam, Review of System, Family & Social History, Home Medications, Labs, EKG , Allergies, Vital Signs/Nurse's Notes, Impression/Plan Ida Rogue (MD)  (Signed 20-Aug-15 19:46)  Authored: General Aspect/Present Illness, History and Physical Exam, Review of System, Family & Social History, Past Medical History, Health Issues,  EKG , Radiology, Impression/Plan  Co-Signer: General Aspect/Present Illness, Home Medications, Allergies   Last Updated: 20-Aug-15 19:46 by Ida Rogue (MD)

## 2014-09-24 NOTE — H&P (Signed)
PATIENT NAME:  Erika Huff, FARO MR#:  [id number] DATE OF BIRTH:  [birth year]  DATE OF ADMISSION:  06/10/2011  REFERRING PHYSICIAN: Dr. Jasmine December    PRIMARY CARE PHYSICIAN: Dr. Jeananne Rama    PRESENTING COMPLAINT: Chest pain, left arm pain, diaphoresis, "passing out".   HISTORY OF PRESENT ILLNESS: Ms. Gafford is an 81 year old woman with history of diabetes, hypertension, chronic low back pain and leg pain, history of atrial fibrillation treated at [facility] and previously on Coumadin, osteoarthritis, and osteoporosis who presents with complaints of developing chest pain three days ago. The patient reports she was laying in bed and woke up with severe chest pain across her chest mainly in the center and left side with radiation to her neck and back and down her left arm. She reports the pain persisted to the point where she "passed out" for approximately five hours. When the patient woke up, she did not get evaluated. She thought that she could "live through the pain" but felt weak and had difficulty performing her usual duties. She felt presyncopal with minimal exertion. She still had some chest numbness and some left arm numbness. Her pain did subside but would recur. She presented to orthopedic surgeon's office today for evaluation of her back pain and leg pain that has been chronic and was complaining of the chest pain and was sent over here by Dr. Mauri Pole for further evaluation. Reports that currently she does not have chest pain. She reports that she would have the pain extending down her left leg and now she has right cramping and leg pain. The patient is very anxious. The patient reports feeling diaphoretic with the episode and describes the pain as sharp. She reports that in the past she was treated for a clot somewhere in her body, unclear details.   PAST MEDICAL HISTORY:  1. Diabetes.  2. Hypertension.  3. Diastolic dysfunction congestive heart failure.  4. Chronic low back pain and leg pain.   5. Osteoarthritis.  6. Osteoporosis.  7. History of diverticulosis and diverticular bleed.  8. Vertigo.  9. Irritable bowel syndrome.  10. Nephrolithiasis.  11. History of atrial fibrillation on previous Coumadin.  12. History of lower extremity clot.  13. Trigger thumb.  PAST SURGICAL HISTORY:  1. Exploratory laparotomy to dissect varicose veins around the ovaries.  2. Cholecystectomy.  3. Carpal tunnel surgery.  4. Multiple surgeries for skin cancer.  5. Right elbow surgery.  6. Bilateral knee surgery.  7. Partial hysterectomy.  8. Cardiac catheterization in [place] in 2011.  9. Bilateral cataract surgery.   ALLERGIES: Altace, amoxicillin, Avelox, Benadryl, Celebrex, Cipro, codeine, Cozaar, Diovan, Entex, Evista, Glucophage, Keflex, Norvasc, Paxil, penicillin, sulfa drugs, Tessalon Perles, Vasotec, and Zithromax.   MEDICATIONS:  1. B12 daily.  2. Iron supplement daily.  3. Glipizide 5 mg daily.  4. Aspirin 81 mg daily.  5. Coreg 25 mg b.i.d.  6. Zyrtec 10 mg daily.  7. Hydrochlorothiazide 25 mg daily.  8. Meclizine 12.5 mg as needed for dizziness.  9. Tylenol Extra-Strength as needed.   SOCIAL HISTORY: The patient lives in [place]. Her daughter lives with her. No tobacco, alcohol, or drug use.   FAMILY HISTORY: Diabetes. Son had prostate cancer.   REVIEW OF SYSTEMS: CONSTITUTIONAL: No fevers. She endorsed chills. EYES: She has history of cataracts. RESPIRATORY: No cough, wheezing, hemoptysis. CARDIOVASCULAR: As per history of present illness. GI: No nausea, vomiting, or diarrhea. She has irritable bowel syndrome with constipation and reports occasional blood in her stool. The patient  has history of internal nonbleeding hemorrhoids and colonic angiectasias as well as diverticulosis as seen on colonoscopy in 2009. ENDOCRINE: No polyuria or polydipsia. HEME: No easy bleeding. She has easy bruising. SKIN: No ulcers. MUSCULOSKELETAL: Chronic low back pain and leg pain mainly on  the left side. NEUROLOGIC: Reports left arm and leg numbness with the pain. No history of strokes or seizures. PSYCH: Denies any depression or anxiety but her daughter reports that the patient is severely anxious and she also has some depression for most part of her life.   PHYSICAL EXAMINATION:   VITAL SIGNS: Temperature 97.6, pulse 72, respiratory rate 20, blood pressure 146/86, sating 98% on room air. Her blood pressure peaked to 214/118 during evaluation but improved status post nitro.   GENERAL: Sitting at the edge of the bed in no apparent distress.   HEENT: Normocephalic, atraumatic. Pupils equal, symmetric. Nares without discharge. Moist mucous membrane. The patient is edentate.   NECK: Soft and supple. No adenopathy or JVP.   CARDIOVASCULAR: Non-tachy. No murmurs, rubs, or gallops.   LUNGS: Clear to auscultation bilaterally. No use of accessory muscles or increased respiratory effort.   ABDOMEN: Soft. Positive bowel sounds. No mass appreciated.   EXTREMITIES: Trace edema bilaterally. Dorsal pedis pulses intact.   MUSCULOSKELETAL: No joint effusion.   SKIN: No ulcers.   NEUROLOGIC: No dysarthria or aphasia. Symmetrical strength. No focal deficits.   PSYCH: She is alert and oriented. The patient is cooperative.   PERTINENT LABS AND STUDIES: D-dimer 0.44. Chest x-ray, PA and lateral, without evidence of pneumonia or CHF. There is minimal prominence of the interstitial markings, may reflect low-grade interstitial edema which may be acute or chronic. BNP 295. WBC 9.3, hemoglobin 12.7, hematocrit 37.7, platelets 181, glucose 156, BUN 15, creatinine 0.76, sodium 132, potassium 4.0, chloride 94, carbon dioxide 31, calcium 8.9, anion gap 7. Troponin less than 0.02. EKG with normal sinus rate of 60. No ST changes. T wave inversion in aVR and V1.   ASSESSMENT AND PLAN: Ms. Talerico is an 81 year old woman with history of diabetes, hypertension, atrial fibrillation previously on Coumadin,  vertigo, osteoarthritis, osteoporosis, diastolic dysfunction CHF, chronic low back pain and leg pain presenting with complaints of chest pain, diaphoresis, neck and arm pain.  1. Chest pain with typical and atypical features. EKG and first troponin negative. Will continue on tele. Cycle cardiac enzymes and Myoview. Obtain TSH, fasting lipid panel, A1c. Restart aspirin and beta-blocker. Continue nitro patch and oxygen.  2. Hypertension, accelerated, questionable response to pain versus etiology of chest pain versus white coat syndrome. Her blood pressure normalized status post nitro. Will restart her hydrochlorothiazide, Coreg, and continue him on nitro patch.  3. Diabetes. Hold glipizide. Start on sliding scale insulin.  4. Congestive heart failure, compensated.  5. Chronic back pain and leg pain, chronic symptoms causing her presenting issues and they seem to be combined. Restart Tylenol as needed. Her symptoms could be related to arthritis in her spine as well.   6. History of atrial fibrillation, previously on Coumadin. Treated at [facility]. She remains normal sinus. Restart her Coreg.  7. Prophylaxis with Lovenox, aspirin, and Protonix.   TIME SPENT: Approximately 45 minutes spent on patient care.   ____________________________ Rita Ohara, MD ap:drc D: 06/10/2011 01:22:57 ET T: 06/10/2011 08:26:47 ET JOB#: [id number]  cc: Brien Few Kemiyah Tarazon, MD, <Dictator> Guadalupe Maple, MD Rita Ohara MD ELECTRONICALLY SIGNED 06/21/2011 2:24

## 2014-09-24 NOTE — Discharge Summary (Signed)
PATIENT NAME:  Erika Huff, Erika Huff MR#:  [id number] DATE OF BIRTH:  1923/11/15  DATE OF ADMISSION:  06/10/2011 DATE OF DISCHARGE:  06/10/2011  ADMITTING PHYSICIAN: Dr. Brien Few Phichith  DISCHARGING PHYSICIAN: Dr. Gladstone Lighter  PRIMARY CARE PHYSICIAN: Dr. Golden Pop  CONSULTATIONS IN THE HOSPITAL: None.    DISCHARGE DIAGNOSES:  1. Chest pain, likely musculoskeletal. Negative Myoview.   2. Paroxysmal atrial fibrillation converted back to sinus in the hospital.  3. Multiple medication allergies.  4. Adverse reaction to Protonix in the hospital. 5. Diastolic congestive heart failure.  6. Hypertension.  7. Diabetes mellitus.  8. History of lower extremity clot, currently off of anticoagulation.  9. Osteoarthritis.  10. Chronic low back pain with radicular symptoms to left leg.  11. Vertigo.  12. Irritable bowel syndrome. 13. History of diverticulosis with diverticular bleed.    DISCHARGE HOME MEDICATIONS: No new medications were added and patient is supposed to resume all her home medications which include the following:  1. Vitamin B12 p.o. daily.  2. Glipizide 2.5 mg p.o. daily.   3. Aspirin 81 mg p.o. daily.  4. Zyrtec 10 mg p.o. daily.  5. Coreg 25 mg p.o. b.i.d.  6. Tylenol p.r.n. for arthritis pain.  7. Iron supplements daily.  8. HCTZ 25 mg p.o. daily.  9. Meclizine 12.5 mg p.o. q.8 hours p.r.n. for dizziness.   DISCHARGE ACTIVITY: As tolerated.   DISCHARGE DIET: Low-sodium diet.    FOLLOW UP INSTRUCTIONS:  1. Advised to follow up with PCP in one week.  2. Follow up with ortho for low back pain and radicular symptoms. 3. Cardiology follow up in 2 to 3 weeks.   LABORATORY, DIAGNOSTIC AND RADIOLOGICAL DATA: Labs at the time of discharge:  WBC 9.3, hemoglobin 12.7, hematocrit 37.7, platelet count 181.   Sodium 132, potassium 4.0, chloride 94, bicarbonate 31, BUN 15, creatinine 0.76, glucose 156, calcium 8.9. Troponin x3 have been negative. D-dimer was negative.  Lexiscan stress test showing normal Lexiscan infusion without evidence of acute ischemia or arrhythmia. Normal LV systolic function with ejection fraction of 72% and fixed inferior apical myocardial perfusion defect consistent with a previous infarct present.   BRIEF HOSPITAL COURSE: Ms. Erika Huff is an 81 year old female with past medical history significant for chronic low back pain with radicular symptoms to left leg, history of atrial fibrillation not on any anticoagulation currently, diabetes, hypertension comes to the hospital complaining of severe chest pain. Patient has been having severe back pain radiating down her leg and some neck pain radiating down to her left arm so she went to see Dr. Mauri Pole and reported chest pain so she was sent to the ER. There has been an episode that her pain in the back has caused a syncopal episode too. Patient is very particular about any new medications as she has a history of multiple allergic reactions/adverse reactions to several medications. In the hospital she did have some chest pain initially which sounded more musculoskeletal but she had a stress test done, monitored on telemetry. Her cardiac enzymes have remained negative. Stress test was negative.   The morning of admission she was placed on GI prophylaxis. She did receive a pill of Protonix and described that she had an adverse reaction which included severed pounding headache associated with change in her vision, generalized body aches and tenderness in the chest. She did say her body is very sensitive to any kind of new medications so Protonix was added to her allergy list. Her stress test was  negative. She started to feel better after the Protonix effect was gone. She was discharged to follow up with cardiology as an outpatient. Her vitals otherwise remained stable. No new medication changes have been done to her medications.   DISCHARGE CONDITION: Stable.   DISCHARGE DISPOSITION: Home.   TIME SPENT ON  DISCHARGE: 40 minutes.  ____________________________ Gladstone Lighter, MD rk:cms D: 06/11/2011 16:17:46 ET T: 06/13/2011 12:13:18 ET JOB#: [id number]  cc: Gladstone Lighter, MD, <Dictator> Guadalupe Maple, MD Alysia Penna. Mauri Pole, MD Gladstone Lighter MD ELECTRONICALLY SIGNED 06/16/2011 11:57

## 2014-10-10 DIAGNOSIS — I350 Nonrheumatic aortic (valve) stenosis: Secondary | ICD-10-CM | POA: Insufficient documentation

## 2014-10-10 DIAGNOSIS — I5032 Chronic diastolic (congestive) heart failure: Secondary | ICD-10-CM | POA: Insufficient documentation

## 2015-01-17 NOTE — Patient Outreach (Signed)
[facility]) Care Management  01/17/2015  Erika Huff 06/04/1923 [id number]   Assignment from [place] List to Thea Silversmith, RN.  Thanks, Ronnell Freshwater. [place], [place] Assistant Phone: [phone] Fax: [phone]

## 2015-01-18 ENCOUNTER — Other Ambulatory Visit: Payer: Self-pay

## 2015-01-18 NOTE — Patient Outreach (Signed)
[facility]) Care Management  01/18/2015  CHARISH SCHROEPFER November 23, 1923 [id number]  High risk referral-call to discuss care management program. Person answering the phone stated that member was out of town and she was not sure when member would be back. Care Coordinator's provided contact information.  Plan: follow up call next week if no return call.  Thea Silversmith, RN, MSN, [place] Coordinator Cell: [phone]

## 2015-01-25 ENCOUNTER — Other Ambulatory Visit: Payer: Self-pay

## 2015-01-25 NOTE — Patient Outreach (Signed)
[facility]) Care Management  01/25/2015  Erika Huff 07-13-1923 [id number]  Referral per High risk list-Called [phone] received voice message stating number is no longer in service. Second attempt.  Plan: Geophysicist/field seismologist.  Thea Silversmith, RN, MSN, [place] Coordinator Cell: [phone]

## 2015-02-08 NOTE — Patient Outreach (Signed)
[facility]) Care Management  02/08/2015  TAMMIE YANDA [birth year] [id number]  Assessment: High Risk referral: Second outreach received voice message that the number is no longer in service. Outreach letter sent. No response within 10 days.  Plan: close case and send letter to primary care.  Thea Silversmith, RN, MSN, [place] Coordinator Cell: [phone]

## 2015-02-09 ENCOUNTER — Other Ambulatory Visit: Payer: Self-pay

## 2015-02-09 NOTE — Patient Outreach (Signed)
[facility]) Care Management  02/09/2015  NAJMA BOZARTH 1924/05/15 [id number]  High Risk List- RNCM Called to complete screening. Person answering phone stated Ms. Lehmkuhl was not there. RNCM provided contact information and requested Ms. Limes return call. Outreach letter previously sent-Third call.  Plan: per Policy procedure-Close case. Send letter to primary care provider.  Thea Silversmith, RN, MSN, [place] Coordinator Cell: [phone]

## 2015-02-12 NOTE — Patient Outreach (Signed)
[facility]) Care Management  02/12/2015  Erika Huff December 24, 1923 [id number]   Notification from Thea Silversmith, RN to close case due to unable to contact patient for [place] Management services.  Thanks, Erika Huff. [place], [place] Assistant Phone: [phone] Fax: [phone]

## 2015-08-04 IMAGING — CR DG CHEST 1V PORT
1 series · 1 of 1 positions shown · non-contrast
Comparison: 12/25/2013

CLINICAL DATA: CVA

EXAM:
PORTABLE CHEST - 1 VIEW

[ap]
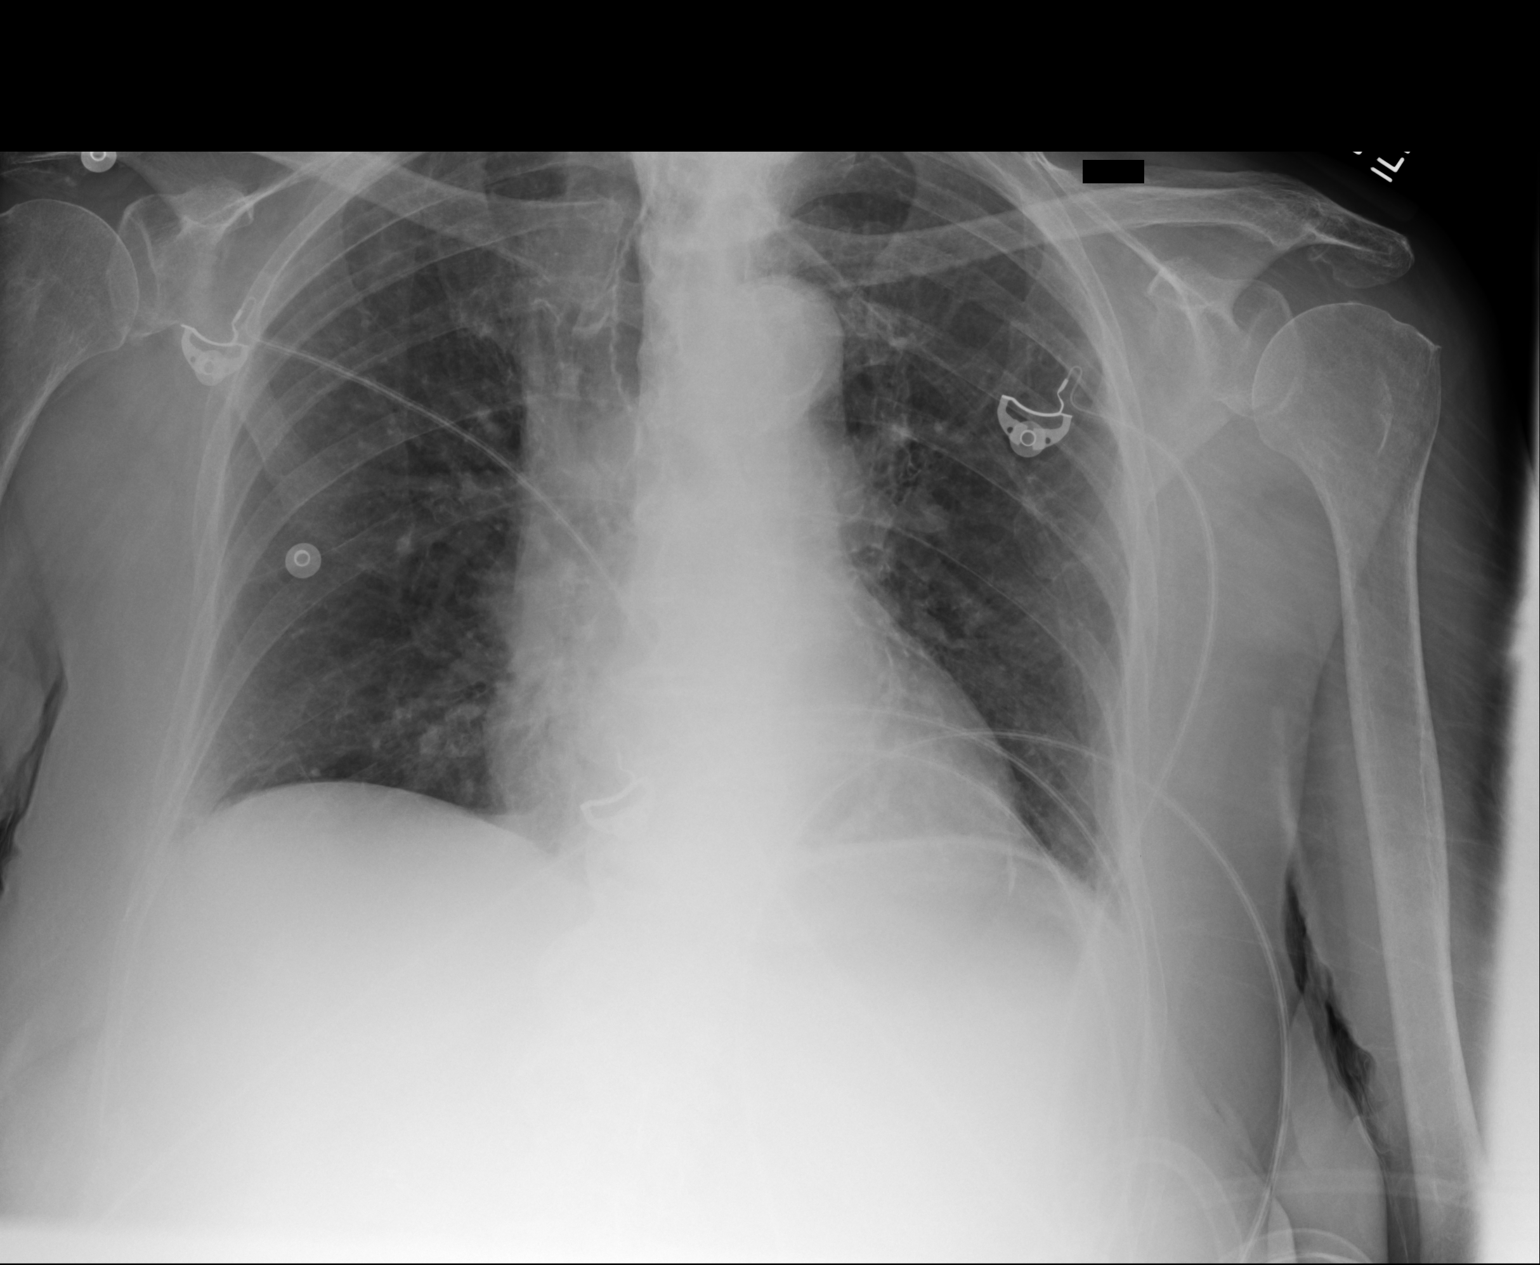

[1 of 1 positions shown; findings below may reference images not displayed]

FINDINGS: Improved aeration in the lung bases since the prior study. Lungs are
now clear. Negative for infiltrate effusion. Negative for heart
failure.
IMPRESSION: No active disease.

## 2015-08-04 IMAGING — CT CT HEAD WITHOUT CONTRAST
2 of 3 series · 16 of 30 positions shown, 18 images · non-contrast
Comparison: MRI 12/26/2013

CLINICAL DATA: Code stroke.  Left-sided weakness.

EXAM:
CT HEAD WITHOUT CONTRAST
TECHNIQUE: Contiguous axial images were obtained from the base of the skull
through the vertex without intravenous contrast.

[Series 2: head wo · axial · 0.39mm/px · z∈[-41,+74]mm · 8 of 31 slices shown, 10 images]
[im 4/31  brain]
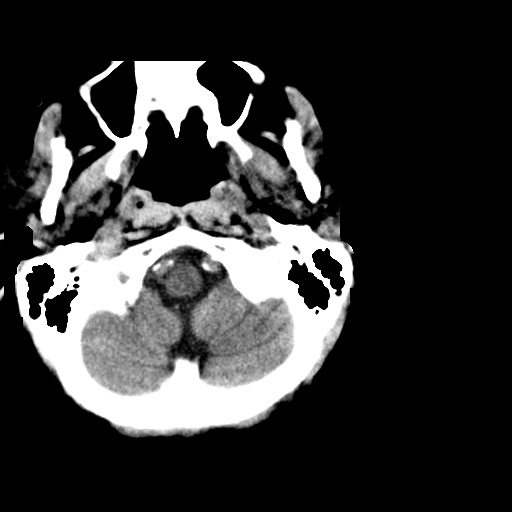
[im 4/31  bone]
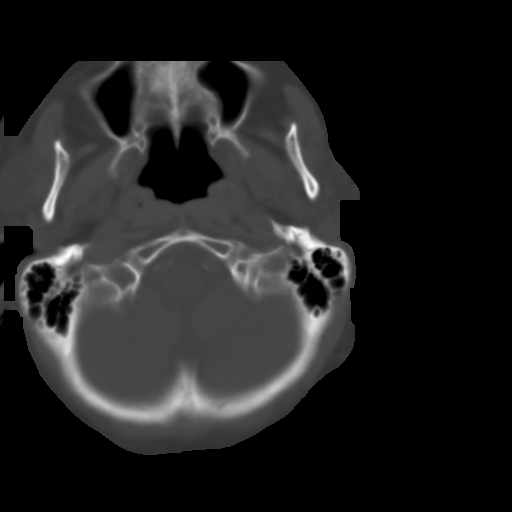
[im 7/31  brain]
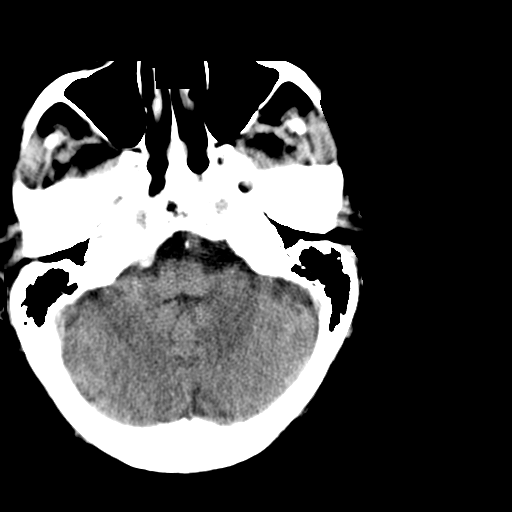
[im 11/31  brain]
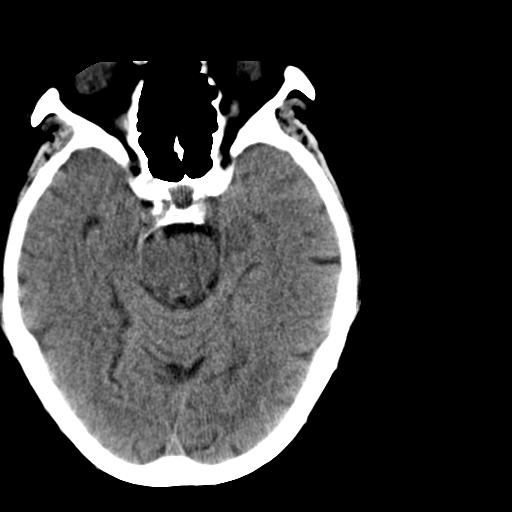
[im 14/31  brain]
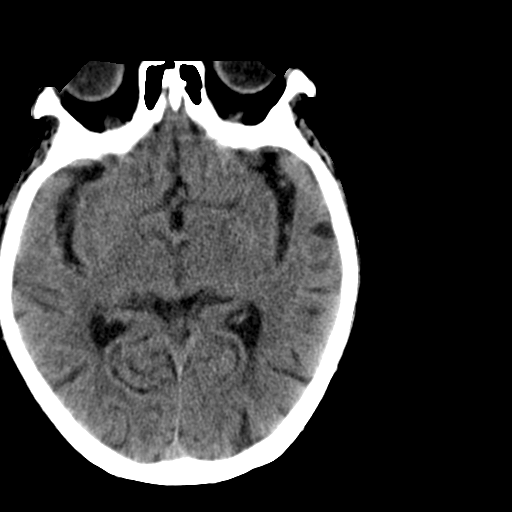
[im 17/31  brain]
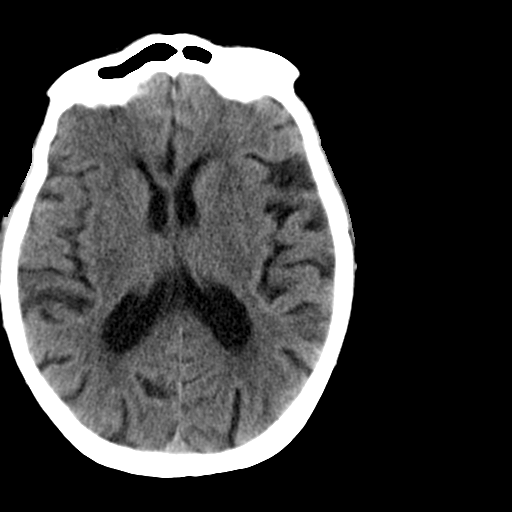
[im 17/31  bone]
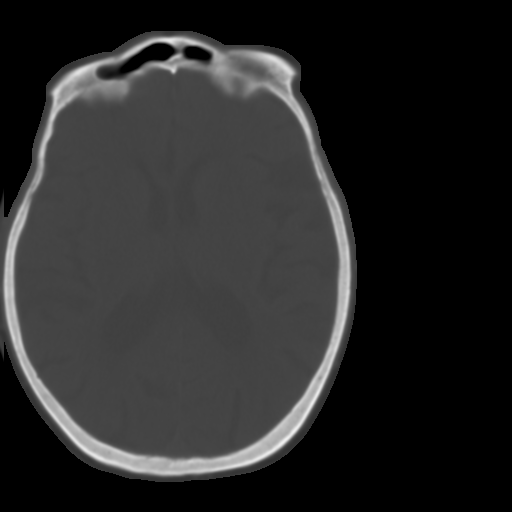
[im 21/31  brain]
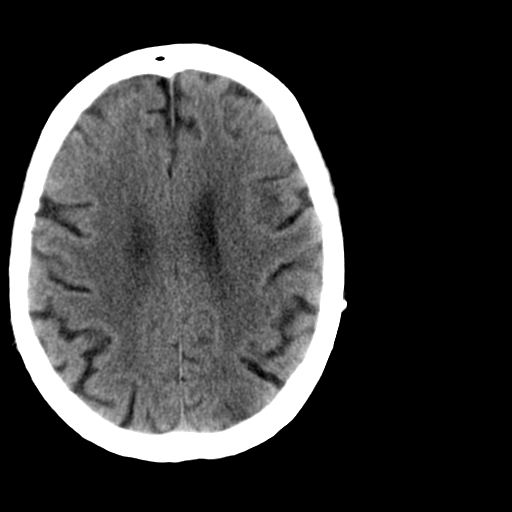
[im 24/31  brain]
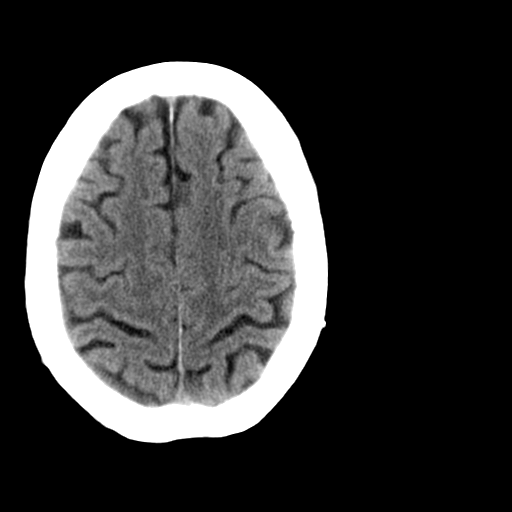
[im 27/31  brain]
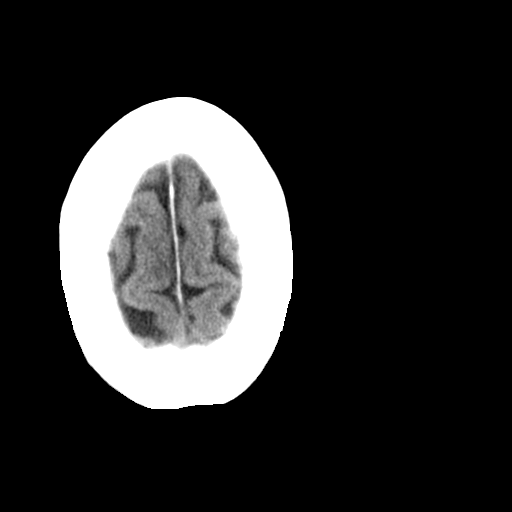

[Series 7: head wo---- · axial · 0.30mm/px · z∈[-10,+95]mm · 8 of 30 slices shown]
[im 4/30  brain]
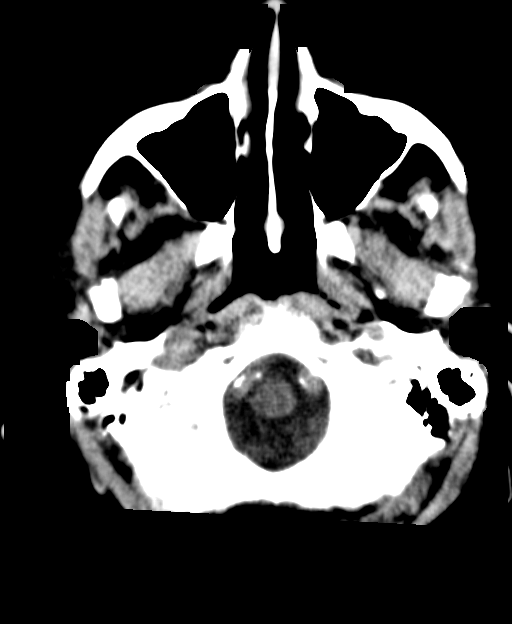
[im 7/30  brain]
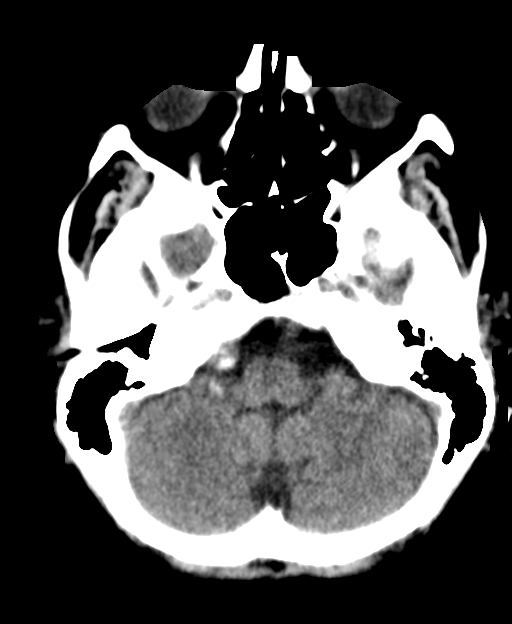
[im 10/30  brain]
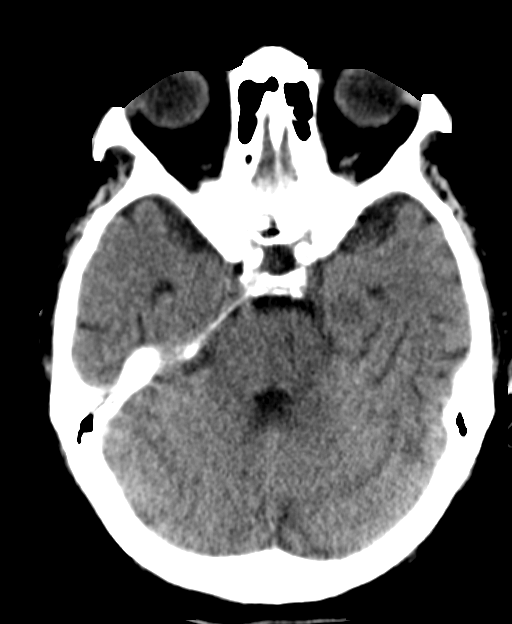
[im 13/30  brain]
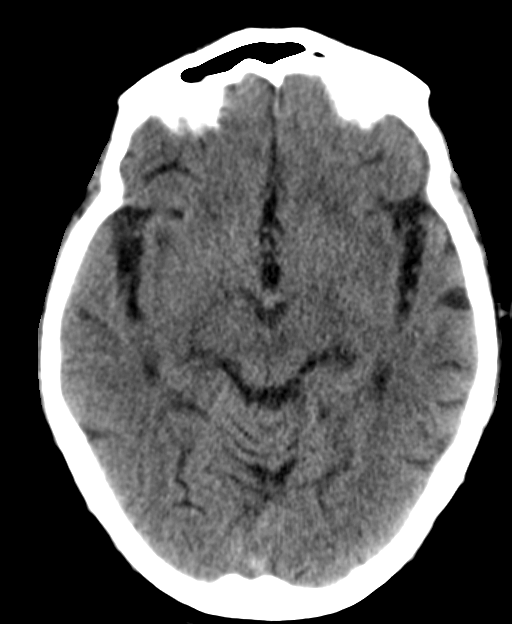
[im 17/30  brain]
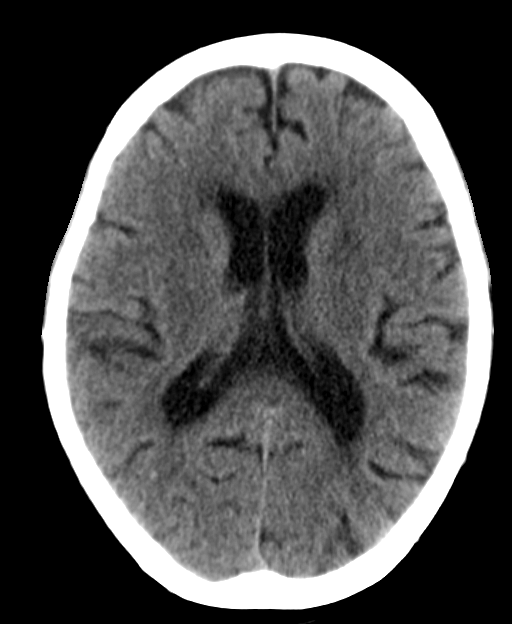
[im 20/30  brain]
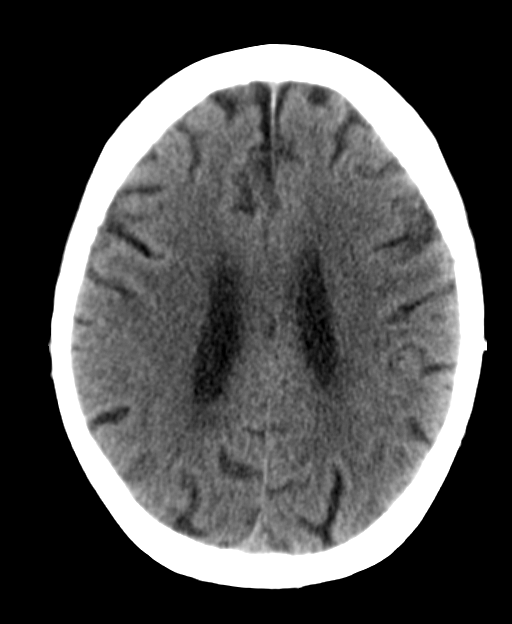
[im 23/30  brain]
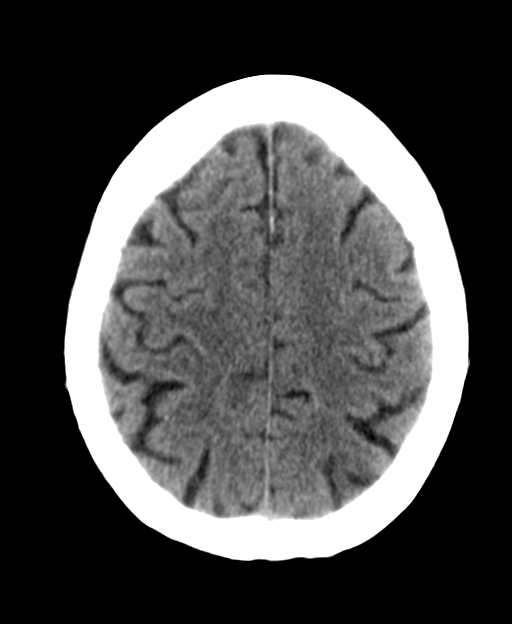
[im 26/30  brain]
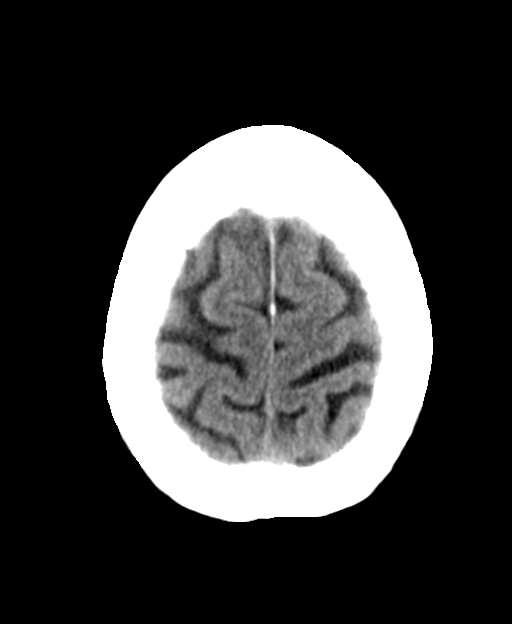

[16 of 30 positions shown; findings below may reference images not displayed]

FINDINGS: Generalized atrophy. Chronic microvascular ischemia in the white
matter.

Negative for acute infarct. Negative for hemorrhage or mass.
Calvarium intact.
IMPRESSION: Atrophy and chronic microvascular ischemia.  No acute abnormality.

Critical Value/emergent results were called by telephone at the time
of interpretation on 01/18/2014 at [DATE] to Dr. CHAMIR MIDO , who
verbally acknowledged these results.
